# Patient Record
Sex: Male | Born: 1960 | Race: White | Hispanic: No | Marital: Single | Smoking: Current every day smoker
Health system: Southern US, Community
[De-identification: ages and names within clinical notes are randomized; demographics above are authoritative.]

## PROBLEM LIST (undated history)

## (undated) DIAGNOSIS — F319 Bipolar disorder, unspecified: Secondary | ICD-10-CM

## (undated) DIAGNOSIS — K219 Gastro-esophageal reflux disease without esophagitis: Secondary | ICD-10-CM

## (undated) DIAGNOSIS — F22 Delusional disorders: Secondary | ICD-10-CM

## (undated) DIAGNOSIS — K859 Acute pancreatitis without necrosis or infection, unspecified: Secondary | ICD-10-CM

## (undated) DIAGNOSIS — I10 Essential (primary) hypertension: Secondary | ICD-10-CM

## (undated) DIAGNOSIS — F1010 Alcohol abuse, uncomplicated: Secondary | ICD-10-CM

## (undated) DIAGNOSIS — M5136 Other intervertebral disc degeneration, lumbar region: Secondary | ICD-10-CM

## (undated) DIAGNOSIS — M549 Dorsalgia, unspecified: Secondary | ICD-10-CM

## (undated) HISTORY — PX: SPLENECTOMY: SUR1306

## (undated) HISTORY — PX: OTHER SURGICAL HISTORY: SHX169

---

## 2000-09-07 ENCOUNTER — Encounter (INDEPENDENT_AMBULATORY_CARE_PROVIDER_SITE_OTHER): Payer: Self-pay | Admitting: Specialist

## 2000-09-07 ENCOUNTER — Inpatient Hospital Stay (HOSPITAL_COMMUNITY): Admission: EM | Admit: 2000-09-07 | Discharge: 2000-09-14 | Payer: Self-pay | Admitting: *Deleted

## 2000-09-07 ENCOUNTER — Encounter: Payer: Self-pay | Admitting: Surgery

## 2000-09-08 ENCOUNTER — Encounter: Payer: Self-pay | Admitting: Internal Medicine

## 2000-09-11 ENCOUNTER — Encounter: Payer: Self-pay | Admitting: General Surgery

## 2000-09-12 ENCOUNTER — Encounter: Payer: Self-pay | Admitting: General Surgery

## 2001-02-27 ENCOUNTER — Encounter: Payer: Self-pay | Admitting: Internal Medicine

## 2001-02-27 ENCOUNTER — Ambulatory Visit (HOSPITAL_COMMUNITY): Admission: RE | Admit: 2001-02-27 | Discharge: 2001-02-27 | Payer: Self-pay | Admitting: Internal Medicine

## 2001-12-08 ENCOUNTER — Encounter: Payer: Self-pay | Admitting: Emergency Medicine

## 2001-12-08 ENCOUNTER — Emergency Department (HOSPITAL_COMMUNITY): Admission: EM | Admit: 2001-12-08 | Discharge: 2001-12-08 | Payer: Self-pay | Admitting: Emergency Medicine

## 2001-12-19 ENCOUNTER — Observation Stay (HOSPITAL_COMMUNITY): Admission: EM | Admit: 2001-12-19 | Discharge: 2001-12-20 | Payer: Self-pay | Admitting: Emergency Medicine

## 2002-03-21 ENCOUNTER — Emergency Department (HOSPITAL_COMMUNITY): Admission: EM | Admit: 2002-03-21 | Discharge: 2002-03-22 | Payer: Self-pay | Admitting: Emergency Medicine

## 2002-04-20 ENCOUNTER — Emergency Department (HOSPITAL_COMMUNITY): Admission: EM | Admit: 2002-04-20 | Discharge: 2002-04-20 | Payer: Self-pay | Admitting: Emergency Medicine

## 2002-04-20 ENCOUNTER — Encounter: Payer: Self-pay | Admitting: Emergency Medicine

## 2004-10-13 ENCOUNTER — Emergency Department (HOSPITAL_COMMUNITY): Admission: EM | Admit: 2004-10-13 | Discharge: 2004-10-13 | Payer: Self-pay | Admitting: Emergency Medicine

## 2008-05-30 ENCOUNTER — Encounter (HOSPITAL_COMMUNITY)
Admission: RE | Admit: 2008-05-30 | Discharge: 2008-06-29 | Payer: Self-pay | Admitting: Physical Medicine and Rehabilitation

## 2008-06-30 ENCOUNTER — Encounter (HOSPITAL_COMMUNITY)
Admission: RE | Admit: 2008-06-30 | Discharge: 2008-07-30 | Payer: Self-pay | Admitting: Physical Medicine and Rehabilitation

## 2009-01-31 ENCOUNTER — Inpatient Hospital Stay (HOSPITAL_COMMUNITY): Admission: EM | Admit: 2009-01-31 | Discharge: 2009-02-02 | Payer: Self-pay | Admitting: Emergency Medicine

## 2010-03-15 ENCOUNTER — Emergency Department (HOSPITAL_COMMUNITY): Admission: EM | Admit: 2010-03-15 | Discharge: 2010-03-15 | Payer: Self-pay | Admitting: Emergency Medicine

## 2010-06-14 ENCOUNTER — Emergency Department (HOSPITAL_COMMUNITY): Admission: EM | Admit: 2010-06-14 | Discharge: 2010-06-14 | Payer: Self-pay | Admitting: Emergency Medicine

## 2010-10-28 LAB — COMPREHENSIVE METABOLIC PANEL WITH GFR
ALT: 108 U/L — ABNORMAL HIGH (ref 0–53)
ALT: 80 U/L — ABNORMAL HIGH (ref 0–53)
AST: 102 U/L — ABNORMAL HIGH (ref 0–37)
AST: 147 U/L — ABNORMAL HIGH (ref 0–37)
Albumin: 2.9 g/dL — ABNORMAL LOW (ref 3.5–5.2)
Albumin: 3.3 g/dL — ABNORMAL LOW (ref 3.5–5.2)
Alkaline Phosphatase: 105 U/L (ref 39–117)
Alkaline Phosphatase: 127 U/L — ABNORMAL HIGH (ref 39–117)
BUN: 4 mg/dL — ABNORMAL LOW (ref 6–23)
BUN: 7 mg/dL (ref 6–23)
CO2: 22 meq/L (ref 19–32)
CO2: 23 meq/L (ref 19–32)
Calcium: 8.4 mg/dL (ref 8.4–10.5)
Calcium: 8.5 mg/dL (ref 8.4–10.5)
Chloride: 102 meq/L (ref 96–112)
Chloride: 99 meq/L (ref 96–112)
Creatinine, Ser: 0.75 mg/dL (ref 0.4–1.5)
Creatinine, Ser: 0.85 mg/dL (ref 0.4–1.5)
GFR calc non Af Amer: >60 mL/min
GFR calc non Af Amer: >60 mL/min
Glucose, Bld: 147 mg/dL — ABNORMAL HIGH (ref 70–99)
Glucose, Bld: 92 mg/dL (ref 70–99)
Potassium: 3.5 meq/L (ref 3.5–5.1)
Potassium: 3.6 meq/L (ref 3.5–5.1)
Sodium: 132 meq/L — ABNORMAL LOW (ref 135–145)
Sodium: 133 meq/L — ABNORMAL LOW (ref 135–145)
Total Bilirubin: 1.9 mg/dL — ABNORMAL HIGH (ref 0.3–1.2)
Total Bilirubin: 2 mg/dL — ABNORMAL HIGH (ref 0.3–1.2)
Total Protein: 5.3 g/dL — ABNORMAL LOW (ref 6.0–8.3)
Total Protein: 6.2 g/dL (ref 6.0–8.3)

## 2010-10-28 LAB — HEPATITIS PANEL, ACUTE
HCV Ab: NEGATIVE
Hep A IgM: NEGATIVE
Hep B C IgM: NEGATIVE
Hepatitis B Surface Ag: NEGATIVE

## 2010-10-28 LAB — CBC
HCT: 41.3 % (ref 39.0–52.0)
HCT: 48.5 % (ref 39.0–52.0)
Hemoglobin: 14.7 g/dL (ref 13.0–17.0)
MCHC: 35.5 g/dL (ref 30.0–36.0)
MCHC: 36 g/dL (ref 30.0–36.0)
MCV: 99.4 fL (ref 78.0–100.0)
MCV: 99.7 fL (ref 78.0–100.0)
Platelets: 111 10*3/uL — ABNORMAL LOW (ref 150–400)
Platelets: 125 10*3/uL — ABNORMAL LOW (ref 150–400)
Platelets: 134 10*3/uL — ABNORMAL LOW (ref 150–400)
RBC: 4.14 MIL/uL — ABNORMAL LOW (ref 4.22–5.81)
RBC: 4.88 MIL/uL (ref 4.22–5.81)
RDW: 14.1 % (ref 11.5–15.5)
WBC: 12.4 10*3/uL — ABNORMAL HIGH (ref 4.0–10.5)
WBC: 13.6 10*3/uL — ABNORMAL HIGH (ref 4.0–10.5)
WBC: 13.9 10*3/uL — ABNORMAL HIGH (ref 4.0–10.5)

## 2010-10-28 LAB — URINE CULTURE: Colony Count: NO GROWTH

## 2010-10-28 LAB — COMPREHENSIVE METABOLIC PANEL
AST: 77 U/L — ABNORMAL HIGH (ref 0–37)
Albumin: 2.8 g/dL — ABNORMAL LOW (ref 3.5–5.2)
BUN: <1 mg/dL — ABNORMAL LOW (ref 6–23)
CO2: 26 mEq/L (ref 19–32)
Calcium: 8.2 mg/dL — ABNORMAL LOW (ref 8.4–10.5)
Creatinine, Ser: 0.71 mg/dL (ref 0.4–1.5)
GFR calc Af Amer: >60 mL/min (ref >60)
GFR calc non Af Amer: >60 mL/min (ref >60)
Total Bilirubin: 1.6 mg/dL — ABNORMAL HIGH (ref 0.3–1.2)

## 2010-10-28 LAB — URINALYSIS, ROUTINE W REFLEX MICROSCOPIC
Hgb urine dipstick: NEGATIVE
Ketones, ur: 40 mg/dL — AB
Leukocytes, UA: NEGATIVE
Specific Gravity, Urine: >1.03 — ABNORMAL HIGH (ref 1.005–1.030)
Urobilinogen, UA: 4 mg/dL — ABNORMAL HIGH (ref 0.0–1.0)
pH: 5.5 (ref 5.0–8.0)

## 2010-10-28 LAB — DIFFERENTIAL
Basophils Absolute: 0 10*3/uL (ref 0.0–0.1)
Basophils Absolute: 0 10*3/uL (ref 0.0–0.1)
Basophils Relative: 0 % (ref 0–1)
Basophils Relative: 0 % (ref 0–1)
Eosinophils Absolute: 0.1 10*3/uL (ref 0.0–0.7)
Eosinophils Relative: 1 % (ref 0–5)
Eosinophils Relative: 2 % (ref 0–5)
Lymphocytes Relative: 14 % (ref 12–46)
Lymphocytes Relative: 18 % (ref 12–46)
Lymphocytes Relative: 24 % (ref 12–46)
Lymphs Abs: 2.4 10*3/uL (ref 0.7–4.0)
Lymphs Abs: 3 10*3/uL (ref 0.7–4.0)
Monocytes Absolute: 0.5 10*3/uL (ref 0.1–1.0)
Monocytes Relative: 4 % (ref 3–12)
Monocytes Relative: 7 % (ref 3–12)
Neutro Abs: 10.6 10*3/uL — ABNORMAL HIGH (ref 1.7–7.7)
Neutro Abs: 8.5 10*3/uL — ABNORMAL HIGH (ref 1.7–7.7)
Neutrophils Relative %: 77 % (ref 43–77)

## 2010-10-28 LAB — URINE MICROSCOPIC-ADD ON

## 2010-10-28 LAB — RAPID URINE DRUG SCREEN, HOSP PERFORMED
Barbiturates: NOT DETECTED
Benzodiazepines: POSITIVE — AB
Cocaine: NOT DETECTED
Tetrahydrocannabinol: NOT DETECTED

## 2010-10-28 LAB — TRIGLYCERIDES: Triglycerides: 2883 mg/dL — ABNORMAL HIGH (ref <150)
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
## 2011-04-18 ENCOUNTER — Emergency Department (HOSPITAL_COMMUNITY)
Admission: EM | Admit: 2011-04-18 | Discharge: 2011-04-18 | Disposition: A | Discharge status: Home / Self Care | Payer: Medicaid Other | Attending: Emergency Medicine | Admitting: Emergency Medicine

## 2011-04-18 ENCOUNTER — Encounter: Payer: Self-pay | Admitting: *Deleted

## 2011-04-18 DIAGNOSIS — T1590XA Foreign body on external eye, part unspecified, unspecified eye, initial encounter: Secondary | ICD-10-CM | POA: Insufficient documentation

## 2011-04-18 DIAGNOSIS — H16009 Unspecified corneal ulcer, unspecified eye: Secondary | ICD-10-CM | POA: Insufficient documentation

## 2011-04-18 DIAGNOSIS — H538 Other visual disturbances: Secondary | ICD-10-CM | POA: Insufficient documentation

## 2011-04-18 DIAGNOSIS — X58XXXA Exposure to other specified factors, initial encounter: Secondary | ICD-10-CM | POA: Insufficient documentation

## 2011-04-18 DIAGNOSIS — H5710 Ocular pain, unspecified eye: Secondary | ICD-10-CM | POA: Insufficient documentation

## 2011-04-18 DIAGNOSIS — F17200 Nicotine dependence, unspecified, uncomplicated: Secondary | ICD-10-CM | POA: Insufficient documentation

## 2011-04-18 HISTORY — DX: Essential (primary) hypertension: I10

## 2011-04-18 HISTORY — DX: Bipolar disorder, unspecified: F31.9

## 2011-04-18 MED ORDER — TETRACAINE HCL 0.5 % OP SOLN
2.0000 [drp] | Freq: Once | OPHTHALMIC | Status: AC
  Administered 2011-04-18: 2 [drp] via OPHTHALMIC
  Filled 2011-04-18: qty 2

## 2011-04-18 MED ORDER — ERYTHROMYCIN 5 MG/GM OP OINT: TOPICAL_OINTMENT | OPHTHALMIC | Status: AC

## 2011-04-18 MED ORDER — HYDROCODONE-ACETAMINOPHEN 5-325 MG PO TABS
2.0000 | ORAL_TABLET | Freq: Once | ORAL | Status: AC
  Administered 2011-04-18: 2 via ORAL
  Filled 2011-04-18: qty 2

## 2011-04-18 MED ORDER — HYDROCODONE-ACETAMINOPHEN 5-500 MG PO TABS: 1.0000 | ORAL_TABLET | Freq: Four times a day (QID) | ORAL | Status: AC | PRN

## 2011-04-18 MED ORDER — FLUORESCEIN SODIUM 1 MG OP STRP
1.0000 | ORAL_STRIP | Freq: Once | OPHTHALMIC | Status: AC
  Administered 2011-04-18: 11:00:00 via OPHTHALMIC
  Filled 2011-04-18: qty 1
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
## 2011-08-17 ENCOUNTER — Emergency Department (HOSPITAL_COMMUNITY)
Admission: EM | Admit: 2011-08-17 | Discharge: 2011-08-17 | Disposition: A | Discharge status: Home / Self Care | Payer: Medicaid Other | Attending: Emergency Medicine | Admitting: Emergency Medicine

## 2011-08-17 ENCOUNTER — Encounter (HOSPITAL_COMMUNITY): Payer: Self-pay

## 2011-08-17 DIAGNOSIS — M51379 Other intervertebral disc degeneration, lumbosacral region without mention of lumbar back pain or lower extremity pain: Secondary | ICD-10-CM | POA: Insufficient documentation

## 2011-08-17 DIAGNOSIS — S39012A Strain of muscle, fascia and tendon of lower back, initial encounter: Secondary | ICD-10-CM

## 2011-08-17 DIAGNOSIS — F17200 Nicotine dependence, unspecified, uncomplicated: Secondary | ICD-10-CM | POA: Insufficient documentation

## 2011-08-17 DIAGNOSIS — Z9089 Acquired absence of other organs: Secondary | ICD-10-CM | POA: Insufficient documentation

## 2011-08-17 DIAGNOSIS — I10 Essential (primary) hypertension: Secondary | ICD-10-CM | POA: Insufficient documentation

## 2011-08-17 DIAGNOSIS — R000 Tachycardia, unspecified: Secondary | ICD-10-CM | POA: Insufficient documentation

## 2011-08-17 DIAGNOSIS — IMO0002 Reserved for concepts with insufficient information to code with codable children: Secondary | ICD-10-CM | POA: Insufficient documentation

## 2011-08-17 DIAGNOSIS — F319 Bipolar disorder, unspecified: Secondary | ICD-10-CM | POA: Insufficient documentation

## 2011-08-17 DIAGNOSIS — M5137 Other intervertebral disc degeneration, lumbosacral region: Secondary | ICD-10-CM | POA: Insufficient documentation

## 2011-08-17 DIAGNOSIS — X500XXA Overexertion from strenuous movement or load, initial encounter: Secondary | ICD-10-CM | POA: Insufficient documentation

## 2011-08-17 HISTORY — DX: Other intervertebral disc degeneration, lumbar region: M51.36

## 2011-08-17 MED ORDER — HYDROCODONE-ACETAMINOPHEN 5-325 MG PO TABS: 2.0000 | ORAL_TABLET | ORAL | Status: AC | PRN

## 2011-08-17 MED ORDER — HYDROCODONE-ACETAMINOPHEN 5-325 MG PO TABS
2.0000 | ORAL_TABLET | Freq: Once | ORAL | Status: AC
  Administered 2011-08-17: 2 via ORAL
  Filled 2011-08-17: qty 2

## 2011-08-17 NOTE — ED Provider Notes (Signed)
History     CSN: [id number]  Arrival date & time 08/17/11  1033   First MD Initiated Contact with Patient 08/17/11 1101      Chief Complaint  Patient presents with  . Back Pain    (Consider location/radiation/quality/duration/timing/severity/associated sxs/prior treatment) HPI Comments: Patient here with a history of lumbar disc disease with acute onset of lower back pain with radiation down the right leg - states slipped and fell onto right side of lower back - reports pain worse with movement.  Denies weakness, numbness or tingling.  Patient is a 51 y.o. male presenting with back pain. The history is provided by the patient. No language interpreter was used.  Back Pain  This is a new problem. The current episode started yesterday. The problem occurs constantly. The problem has not changed since onset.The pain is associated with falling and lifting heavy objects. The pain is present in the lumbar spine. The quality of the pain is described as stabbing. The pain radiates to the right thigh. The pain is at a severity of 6/10. The pain is moderate. The symptoms are aggravated by twisting and bending. The pain is the same all the time. Stiffness is present all day. Pertinent negatives include no chest pain, no fever, no numbness, no weight loss, no headaches, no abdominal pain, no abdominal swelling, no bowel incontinence, no perianal numbness, no bladder incontinence, no dysuria, no pelvic pain, no leg pain, no paresthesias, no paresis, no tingling and no weakness. He has tried nothing for the symptoms. The treatment provided no relief.    Past Medical History  Diagnosis Date  . Bipolar affective   . Hypertension   . DDD (degenerative disc disease), lumbar     Past Surgical History  Procedure Date  . Spleen spleen removed  . Splenectomy     No family history on file.  History  Substance Use Topics  . Smoking status: Current Everyday Smoker  . Smokeless tobacco: Not on file  .  Alcohol Use: No     occasionally      Review of Systems  Constitutional: Negative for fever and weight loss.  Cardiovascular: Negative for chest pain.  Gastrointestinal: Negative for abdominal pain and bowel incontinence.  Genitourinary: Negative for bladder incontinence, dysuria and pelvic pain.  Musculoskeletal: Positive for back pain.  Neurological: Negative for tingling, weakness, numbness, headaches and paresthesias.  All other systems reviewed and are negative.    Allergies  Review of patient's allergies indicates no known allergies.  Home Medications   Current Outpatient Rx  Name Route Sig Dispense Refill  . CLONAZEPAM 1 MG PO TABS Oral Take 1 mg by mouth 3 (three) times daily as needed. For anxiety     . GABAPENTIN 300 MG PO CAPS Oral Take 300 mg by mouth 3 (three) times daily.      . THIOTHIXENE 5 MG PO CAPS Oral Take 5 mg by mouth at bedtime and may repeat dose one time if needed.        BP 149/104  Pulse 110  Temp(Src) 97.1 F (36.2 C) (Oral)  Resp 20  Ht 6\' 3"  (1.905 m)  Wt 200 lb (90.719 kg)  BMI 25.00 kg/m2  SpO2 98%  Physical Exam  Nursing note and vitals reviewed. Constitutional: He is oriented to person, place, and time. He appears well-developed and well-nourished. No distress.  HENT:  Head: Normocephalic and atraumatic.  Right Ear: External ear normal.  Left Ear: External ear normal.  Nose: Nose normal.  Mouth/Throat: Oropharynx is clear and moist. No oropharyngeal exudate.  Eyes: Conjunctivae are normal. Pupils are equal, round, and reactive to light. No scleral icterus.  Neck: Normal range of motion. Neck supple.  Cardiovascular: Normal rate, regular rhythm and normal heart sounds.  Exam reveals no gallop and no friction rub.   No murmur heard.      tachycardia  Pulmonary/Chest: Effort normal and breath sounds normal. He exhibits no tenderness.  Abdominal: Soft. Bowel sounds are normal. He exhibits no distension. There is no tenderness.    Musculoskeletal:       Lumbar back: He exhibits decreased range of motion, tenderness, pain and spasm. He exhibits no bony tenderness, no swelling and no edema.  Lymphadenopathy:    He has no cervical adenopathy.  Neurological: He is alert and oriented to person, place, and time. He has normal reflexes. No cranial nerve deficit. He exhibits normal muscle tone. Coordination normal.  Skin: Skin is warm and dry.  Psychiatric: He has a normal mood and affect. His behavior is normal. Judgment and thought content normal.    ED Course  Procedures (including critical care time)  Labs Reviewed - No data to display No results found.   Lumbar strain    MDM  Patient without alarming signs of cauda equina or epidural abscess or hematoma. Believe increase in heart rate and blood pressure related to pain.       Izola Price [place], [place] 08/17/11 1112

## 2011-08-17 NOTE — ED Notes (Signed)
Pt presents with right sided lower back pain that started yesterday after carrying wood into house.
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
## 2011-08-19 NOTE — ED Provider Notes (Signed)
Medical screening examination/treatment/procedure(s) were performed by non-physician practitioner and as supervising physician I was immediately available for consultation/collaboration.  Vikki Gains, MD 08/19/11 1045 

## 2011-09-16 ENCOUNTER — Emergency Department (HOSPITAL_COMMUNITY)
Admission: EM | Admit: 2011-09-16 | Discharge: 2011-09-16 | Disposition: A | Discharge status: Home / Self Care | Payer: Medicaid Other | Attending: Emergency Medicine | Admitting: Emergency Medicine

## 2011-09-16 ENCOUNTER — Encounter (HOSPITAL_COMMUNITY): Payer: Self-pay | Admitting: *Deleted

## 2011-09-16 DIAGNOSIS — F17200 Nicotine dependence, unspecified, uncomplicated: Secondary | ICD-10-CM | POA: Insufficient documentation

## 2011-09-16 DIAGNOSIS — R6884 Jaw pain: Secondary | ICD-10-CM | POA: Insufficient documentation

## 2011-09-16 DIAGNOSIS — R221 Localized swelling, mass and lump, neck: Secondary | ICD-10-CM | POA: Insufficient documentation

## 2011-09-16 DIAGNOSIS — R000 Tachycardia, unspecified: Secondary | ICD-10-CM | POA: Insufficient documentation

## 2011-09-16 DIAGNOSIS — K089 Disorder of teeth and supporting structures, unspecified: Secondary | ICD-10-CM | POA: Insufficient documentation

## 2011-09-16 DIAGNOSIS — Z79899 Other long term (current) drug therapy: Secondary | ICD-10-CM | POA: Insufficient documentation

## 2011-09-16 DIAGNOSIS — R220 Localized swelling, mass and lump, head: Secondary | ICD-10-CM | POA: Insufficient documentation

## 2011-09-16 DIAGNOSIS — K047 Periapical abscess without sinus: Secondary | ICD-10-CM | POA: Insufficient documentation

## 2011-09-16 HISTORY — DX: Dorsalgia, unspecified: M54.9

## 2011-09-16 MED ORDER — HYDROCODONE-ACETAMINOPHEN 5-325 MG PO TABS: 1.0000 | ORAL_TABLET | ORAL | Status: AC | PRN

## 2011-09-16 MED ORDER — AMOXICILLIN 500 MG PO CAPS: 500.0000 mg | ORAL_CAPSULE | Freq: Three times a day (TID) | ORAL | Status: AC

## 2011-09-16 MED ORDER — AMOXICILLIN 250 MG PO CAPS
500.0000 mg | ORAL_CAPSULE | Freq: Once | ORAL | Status: AC
  Administered 2011-09-16: 500 mg via ORAL
  Filled 2011-09-16: qty 2

## 2011-09-16 MED ORDER — HYDROCODONE-ACETAMINOPHEN 5-325 MG PO TABS
1.0000 | ORAL_TABLET | Freq: Once | ORAL | Status: AC
  Administered 2011-09-16: 1 via ORAL
  Filled 2011-09-16: qty 1

## 2011-09-16 NOTE — ED Notes (Signed)
Patient with dental pain that started last night and once he got up this morning, noticed large amount of swelling to left cheek and jaw, denies any difficulty breathing

## 2011-09-16 NOTE — Discharge Instructions (Signed)
Dental Abscess °A dental abscess usually starts from an infected tooth. Antibiotic medicine and pain pills can be helpful, but dental infections require the attention of a dentist. Rinse around the infected area often with salt water (a pinch of salt in 8 oz of warm water). Do not apply heat to the outside of your face. See your dentist or oral surgeon as soon as possible.  °SEEK IMMEDIATE MEDICAL CARE IF: °· You have increasing, severe pain that is not relieved by medicine.  °· You or your child has an oral temperature above 102° F (38.9° C), not controlled by medicine.  °· Your baby is older than 3 months with a rectal temperature of 102° F (38.9° C) or higher.  °· Your baby is 3 months old or younger with a rectal temperature of 100.4° F (38° C) or higher.  °· You develop chills, severe headache, difficulty breathing, or trouble swallowing.  °· You have swelling in the neck or around the eye.  °Document Released: 07/08/2005 Document Revised: 03/20/2011 Document Reviewed: 12/17/2006 °[employer]® Patient Information ©2012 [employer]. °

## 2011-09-22 NOTE — ED Provider Notes (Signed)
History     CSN: [id number]  Arrival date & time 09/16/11  1235   First MD Initiated Contact with Patient 09/16/11 1527      Chief Complaint  Patient presents with  . Facial Swelling  . Dental Pain    (Consider location/radiation/quality/duration/timing/severity/associated sxs/prior treatment) Patient is a 51 y.o. male presenting with tooth pain. The history is provided by the patient.  Dental PainThe primary symptoms include mouth pain. Primary symptoms do not include headaches, fever, shortness of breath or sore throat. The symptoms began yesterday. The symptoms are worsening. The symptoms are new. The symptoms occur constantly.  Mouth pain began 12 - 24 hours ago. Mouth pain occurs constantly. Affected locations include: teeth. At its highest the mouth pain was at 10/10. The mouth pain is currently at 10/10.  Additional symptoms include: dental sensitivity to temperature, gum swelling, gum tenderness, jaw pain and facial swelling. Additional symptoms do not include: swollen glands.    Past Medical History  Diagnosis Date  . Bipolar affective   . Hypertension   . DDD (degenerative disc disease), lumbar   . Back pain     Past Surgical History  Procedure Date  . Spleen spleen removed  . Splenectomy     History reviewed. No pertinent family history.  History  Substance Use Topics  . Smoking status: Current Everyday Smoker -- 0.5 packs/day    Types: Cigarettes  . Smokeless tobacco: Not on file  . Alcohol Use: Yes     occasionally      Review of Systems  Constitutional: Negative for fever.  HENT: Positive for facial swelling and dental problem. Negative for congestion, sore throat and neck pain.   Eyes: Negative.   Respiratory: Negative for chest tightness and shortness of breath.   Cardiovascular: Negative for chest pain.  Gastrointestinal: Negative for nausea and abdominal pain.  Genitourinary: Negative.   Musculoskeletal: Negative for joint swelling and  arthralgias.  Skin: Negative.  Negative for rash and wound.  Neurological: Negative for dizziness, weakness, light-headedness, numbness and headaches.  Hematological: Negative.   Psychiatric/Behavioral: Negative.     Allergies  Review of patient's allergies indicates no known allergies.  Home Medications   Current Outpatient Rx  Name Route Sig Dispense Refill  . ALBUTEROL SULFATE HFA 108 (90 BASE) MCG/ACT IN AERS Inhalation Inhale 2 puffs into the lungs 4 (four) times daily as needed. For shortness of breath    . BC HEADACHE PO Oral Take 1-2 packets by mouth as needed. For pain    . CLONAZEPAM 1 MG PO TABS Oral Take 1 mg by mouth 3 (three) times daily as needed. For anxiety     . GABAPENTIN 300 MG PO CAPS Oral Take 300 mg by mouth 3 (three) times daily.      . THIOTHIXENE 5 MG PO CAPS Oral Take 5 mg by mouth at bedtime and may repeat dose one time if needed.      . AMOXICILLIN 500 MG PO CAPS Oral Take 1 capsule (500 mg total) by mouth 3 (three) times daily. 30 capsule 0  . HYDROCODONE-ACETAMINOPHEN 5-325 MG PO TABS Oral Take 1 tablet by mouth every 4 (four) hours as needed for pain. 15 tablet 0    BP 121/95  Pulse 137  Temp(Src) 98.9 F (37.2 C) (Oral)  Resp 20  Ht 6\' 3"  (1.905 m)  Wt 210 lb (95.255 kg)  BMI 26.25 kg/m2  SpO2 97%  Physical Exam  Constitutional: He is oriented to person, place, and  time. He appears well-developed and well-nourished. No distress.  HENT:  Head: Normocephalic and atraumatic.  Right Ear: Tympanic membrane and external ear normal.  Left Ear: Tympanic membrane and external ear normal.  Mouth/Throat: Oropharynx is clear and moist and mucous membranes are normal. No oral lesions. Dental abscesses present.       Poor dentition.  Edema of left lower gingiva along molar teeth,  No fluctuance or pointing.  Small amount of induration of left lower cheek,  No erythema.  Eyes: Conjunctivae are normal.  Neck: Normal range of motion. Neck supple.    Cardiovascular: Normal rate and normal heart sounds.   No murmur heard.      Tachycardia noted during triage,  Pulse rate 88 during exam.  Pulmonary/Chest: Effort normal. No respiratory distress.  Abdominal: He exhibits no distension.  Musculoskeletal: Normal range of motion.  Lymphadenopathy:    He has no cervical adenopathy.  Neurological: He is alert and oriented to person, place, and time.  Skin: Skin is warm and dry. No erythema.  Psychiatric: He has a normal mood and affect.    ED Course  Procedures (including critical care time)  Labs Reviewed - No data to display No results found.   1. Dental abscess       MDM  Amoxil,  Hydrocodone.  Referral to dental clinics.  Suspect initial tachycardia erroneous measurement.  Patient in no distress except for dental pain.        Candis Musa, PA 09/22/11 2128

## 2011-09-25 NOTE — ED Provider Notes (Signed)
Evaluation and management procedures were performed by the PA/NP under my supervision/collaboration.   Alper Guilmette D Octavis Sheeler, MD 09/25/11 2022 

## 2012-04-05 ENCOUNTER — Emergency Department (HOSPITAL_COMMUNITY): Payer: Medicaid Other

## 2012-04-05 ENCOUNTER — Emergency Department (HOSPITAL_COMMUNITY)
Admission: EM | Admit: 2012-04-05 | Discharge: 2012-04-05 | Disposition: A | Discharge status: Home / Self Care | Payer: Medicaid Other | Attending: Emergency Medicine | Admitting: Emergency Medicine

## 2012-04-05 ENCOUNTER — Encounter (HOSPITAL_COMMUNITY): Payer: Self-pay | Admitting: Emergency Medicine

## 2012-04-05 DIAGNOSIS — S4990XA Unspecified injury of shoulder and upper arm, unspecified arm, initial encounter: Secondary | ICD-10-CM

## 2012-04-05 DIAGNOSIS — F319 Bipolar disorder, unspecified: Secondary | ICD-10-CM | POA: Insufficient documentation

## 2012-04-05 DIAGNOSIS — S4980XA Other specified injuries of shoulder and upper arm, unspecified arm, initial encounter: Secondary | ICD-10-CM | POA: Insufficient documentation

## 2012-04-05 DIAGNOSIS — I10 Essential (primary) hypertension: Secondary | ICD-10-CM | POA: Insufficient documentation

## 2012-04-05 DIAGNOSIS — S46909A Unspecified injury of unspecified muscle, fascia and tendon at shoulder and upper arm level, unspecified arm, initial encounter: Secondary | ICD-10-CM | POA: Insufficient documentation

## 2012-04-05 DIAGNOSIS — F17200 Nicotine dependence, unspecified, uncomplicated: Secondary | ICD-10-CM | POA: Insufficient documentation

## 2012-04-05 MED ORDER — OXYCODONE-ACETAMINOPHEN 5-325 MG PO TABS
1.0000 | ORAL_TABLET | Freq: Once | ORAL | Status: AC
  Administered 2012-04-05: 1 via ORAL
  Filled 2012-04-05: qty 1

## 2012-04-05 MED ORDER — OXYCODONE-ACETAMINOPHEN 5-325 MG PO TABS: 1.0000 | ORAL_TABLET | ORAL | Status: AC | PRN

## 2012-04-05 NOTE — ED Notes (Signed)
Pt with right shoulder pain x one month since a tree branch fell on his arm, states that tingling radiates down arm to hand, states taking Advil at home without relief and at times has taken up to 6 pills at one time, denies any bloody stools

## 2012-04-05 NOTE — ED Notes (Signed)
Mayer Camel, NP with pt at this time

## 2012-04-05 NOTE — ED Notes (Signed)
Per patient: branch fell on arm about a month ago, pain has been going on ever since. Pain is in upper arm. Taking Advil at home for pain

## 2012-04-05 NOTE — ED Provider Notes (Signed)
History     CSN: [id number]  Arrival date & time 04/05/12  1118   First MD Initiated Contact with Patient 04/05/12 1134      Chief Complaint  Patient presents with  . Arm Pain    HPI Kirk Harper is a 51 y.o. male who present to the ED with right shoulder pain. He states that the pain started about a month ago when a tree limb fell on his right arm. He has been taking 6 advil tablets at once for pain. The pain has increased to the point he can not take the pain any longer. He states he has not come before because he is Bipolar and does not like to be around a lot of people. The pain increases with movement. The pain radiates to the right arm down to the fingers with tingling. He describes the pain as throbbing like a tooth ache.  Past Medical History  Diagnosis Date  . Bipolar affective   . Hypertension   . DDD (degenerative disc disease), lumbar   . Back pain     Past Surgical History  Procedure Date  . Spleen spleen removed  . Splenectomy     No family history on file.  History  Substance Use Topics  . Smoking status: Current Every Day Smoker -- 0.5 packs/day    Types: Cigarettes  . Smokeless tobacco: Not on file  . Alcohol Use: Yes     occasionally      Review of Systems  Constitutional: Negative for fever, chills, diaphoresis and fatigue.  HENT: Negative for ear pain, congestion, sore throat, neck pain, neck stiffness, dental problem and sinus pressure.   Eyes: Negative for photophobia, pain and discharge.  Respiratory: Positive for cough (smoker). Negative for chest tightness and wheezing.   Cardiovascular: Negative for chest pain and palpitations.  Gastrointestinal: Negative for nausea, vomiting, abdominal pain, diarrhea, constipation and abdominal distention.  Genitourinary: Negative for dysuria, frequency, flank pain and difficulty urinating.  Musculoskeletal: Negative for myalgias, back pain and gait problem.       Right shoulder pain.  Skin: Negative  for color change and rash.  Neurological: Negative for dizziness, speech difficulty, weakness, light-headedness, numbness and headaches.  Psychiatric/Behavioral: Negative for confusion and agitation.       Bipolar     Allergies  Review of patient's allergies indicates no known allergies.  Home Medications   Current Outpatient Rx  Name Route Sig Dispense Refill  . GABAPENTIN 300 MG PO CAPS Oral Take 300 mg by mouth 3 (three) times daily.      Marland Kitchen OVER THE COUNTER MEDICATION Oral Take 2-6 tablets by mouth every 6 (six) hours as needed. For pain ( OTC pain reliever)    . THIOTHIXENE 5 MG PO CAPS Oral Take 5 mg by mouth at bedtime and may repeat dose one time if needed.        BP 155/110  Pulse 93  Temp 97.4 F (36.3 C) (Oral)  Ht 6\' 2"  (1.88 m)  Wt 185 lb (83.915 kg)  BMI 23.75 kg/m2  SpO2 96%  Physical Exam  Nursing note and vitals reviewed. Constitutional: He is oriented to person, place, and time. He appears well-developed and well-nourished. No distress.  HENT:  Head: Normocephalic and atraumatic.  Eyes: EOM are normal. Pupils are equal, round, and reactive to light.  Neck: Neck supple.  Cardiovascular: Normal rate and regular rhythm.   Pulmonary/Chest: Effort normal and breath sounds normal. No respiratory distress. He has no  wheezes.  Musculoskeletal: He exhibits no edema.       Pain with range of motion right shoulder. Good grips and equal bilateral. Radial pulse present and adequate circulation. Pain increases with raising hand over head. Tender on palpation posterior aspect of right shoulder.  Neurological: He is alert and oriented to person, place, and time. He has normal strength. No cranial nerve deficit or sensory deficit.  Skin: Skin is warm and dry.  Psychiatric: His speech is normal and behavior is normal. Judgment and thought content normal. His mood appears anxious.   Dg Shoulder Right  04/05/2012  *RADIOLOGY REPORT*  Clinical Data: Progressive right shoulder  pain after injury 1 month ago.  RIGHT SHOULDER - 2+ VIEW  Comparison: None.  Findings: No evidence of acute fracture or glenohumeral dislocation.  Subacromial space well preserved.  Acromioclavicular joint intact without significant degenerative change.  No intrinsic osseous abnormalities.  IMPRESSION: Normal examination.   Original Report Authenticated By: Arnell Sieving, M.D.    Assessment: 51 y.o. male with pain in the right shoulder  Plan:  Pain management   Sling   Ortho follow up I discussed in detail with the patient to decrease his dose of advil from 6 pills every 6 hours to 3 pills every 6 hours. I discussed results of x-rays and need for follow up with Ortho. The patient voices understanding.   Discussed with the patient and all questioned fully answered.   Medication List     As of 04/05/2012  3:04 PM    START taking these medications         oxyCODONE-acetaminophen 5-325 MG per tablet   Commonly known as: PERCOCET/ROXICET   Take 1 tablet by mouth every 4 (four) hours as needed for pain.      ASK your doctor about these medications         gabapentin 300 MG capsule   Commonly known as: NEURONTIN      OVER THE COUNTER MEDICATION      thiothixene 5 MG capsule   Commonly known as: NAVANE          Where to get your medications    These are the prescriptions that you need to pick up.   You may get these medications from any pharmacy.         oxyCODONE-acetaminophen 5-325 MG per tablet               ED Course  Procedures         [facility], NP 04/05/12 1504

## 2012-04-07 NOTE — ED Provider Notes (Signed)
Medical screening examination/treatment/procedure(s) were performed by non-physician practitioner and as supervising physician I was immediately available for consultation/collaboration.  Donnetta Hutching, MD 04/07/12 [phone]

## 2012-04-11 ENCOUNTER — Encounter (HOSPITAL_COMMUNITY): Payer: Self-pay

## 2012-04-11 ENCOUNTER — Emergency Department (HOSPITAL_COMMUNITY)
Admission: EM | Admit: 2012-04-11 | Discharge: 2012-04-11 | Disposition: A | Discharge status: Home / Self Care | Payer: Medicaid Other | Attending: Emergency Medicine | Admitting: Emergency Medicine

## 2012-04-11 DIAGNOSIS — I10 Essential (primary) hypertension: Secondary | ICD-10-CM | POA: Insufficient documentation

## 2012-04-11 DIAGNOSIS — Z760 Encounter for issue of repeat prescription: Secondary | ICD-10-CM | POA: Insufficient documentation

## 2012-04-11 DIAGNOSIS — M25519 Pain in unspecified shoulder: Secondary | ICD-10-CM | POA: Insufficient documentation

## 2012-04-11 DIAGNOSIS — F17200 Nicotine dependence, unspecified, uncomplicated: Secondary | ICD-10-CM | POA: Insufficient documentation

## 2012-04-11 DIAGNOSIS — F319 Bipolar disorder, unspecified: Secondary | ICD-10-CM | POA: Insufficient documentation

## 2012-04-11 HISTORY — DX: Delusional disorders: F22

## 2012-04-11 MED ORDER — METHOCARBAMOL 500 MG PO TABS: 500.0000 mg | ORAL_TABLET | Freq: Two times a day (BID) | ORAL | Status: DC

## 2012-04-11 MED ORDER — TRAMADOL HCL 50 MG PO TABS: 50.0000 mg | ORAL_TABLET | Freq: Four times a day (QID) | ORAL | Status: DC | PRN

## 2012-04-11 NOTE — ED Provider Notes (Signed)
History  This chart was scribed for Kirk Octave, MD by Erskine Emery. This patient was seen in room APA19/APA19 and the patient's care was started at 7:24.   CSN: [id number]  Arrival date & time 04/11/12  0714   First MD Initiated Contact with Patient 04/11/12 [phone]      Chief Complaint  Patient presents with  . Arm Pain  . Medication Refill    (Consider location/radiation/quality/duration/timing/severity/associated sxs/prior treatment) The history is provided by the patient. No language interpreter was used.  Kirk Harper is a 51 y.o. male who presents to the Emergency Department complaining of right arm pain, from the forearm to the right side of the neck, and arm numbness since getting hit with a tree branch 1 month ago. Pt did not visit the hospital just after the incident but he was here last Sunday for the same complaint; he was given pain medication and released with instructions to follow up with Dr. Hilda Lias (orthopedic surgeon). The pt's appointment was cancelled due to medicaid complications and he is now out of pain medication. Pt reports a h/o bipolar disorder and paranoia.   Past Medical History  Diagnosis Date  . Bipolar affective   . Hypertension   . DDD (degenerative disc disease), lumbar   . Back pain   . Paranoia     Past Surgical History  Procedure Date  . Spleen spleen removed  . Splenectomy     No family history on file.  History  Substance Use Topics  . Smoking status: Current Every Day Smoker -- 0.5 packs/day    Types: Cigarettes  . Smokeless tobacco: Not on file  . Alcohol Use: Yes     occasionally      Review of Systems A complete 10 system review of systems was obtained and all systems are negative except as noted in the HPI and PMH.    Allergies  Review of patient's allergies indicates no known allergies.  Home Medications   Current Outpatient Rx  Name Route Sig Dispense Refill  . GABAPENTIN 300 MG PO CAPS Oral Take 300 mg by  mouth 3 (three) times daily.      Marland Kitchen OVER THE COUNTER MEDICATION Oral Take 2-6 tablets by mouth every 6 (six) hours as needed. For pain ( OTC pain reliever)    . OXYCODONE-ACETAMINOPHEN 5-325 MG PO TABS Oral Take 1 tablet by mouth every 4 (four) hours as needed for pain. 15 tablet 0  . THIOTHIXENE 5 MG PO CAPS Oral Take 5 mg by mouth at bedtime and may repeat dose one time if needed.        Triage Vitals: BP 128/96  Pulse 97  Temp 97.7 F (36.5 C) (Oral)  Resp 20  SpO2 99%  Physical Exam  Nursing note and vitals reviewed. Constitutional: He is oriented to person, place, and time. He appears well-developed and well-nourished. No distress.  HENT:  Head: Normocephalic and atraumatic.  Eyes: EOM are normal. Pupils are equal, round, and reactive to light.  Neck: Neck supple. No tracheal deviation present.  Cardiovascular: Normal rate.   Pulmonary/Chest: Effort normal. No respiratory distress.  Abdominal: Soft. He exhibits no distension.  Musculoskeletal: Normal range of motion. He exhibits no edema.       No obvious injury. Full ROM of shoulder and elbow. Pain increases with raising the arm over his head. Cranial nerves intact. +2 radial pulse.  Neurological: He is alert and oriented to person, place, and time.  Skin: Skin is warm  and dry.  Psychiatric: He has a normal mood and affect.    ED Course  Procedures (including critical care time) DIAGNOSTIC STUDIES: Oxygen Saturation is 99% on room air, normal by my interpretation.    COORDINATION OF CARE: 7:40--I evaluated the patient and we discussed a treatment plan including review of x-rays to which the pt agreed.   7:47--I rechecked the pt and notified him that his x-rays look fine.   Labs Reviewed - No data to display No results found.   No diagnosis found.    MDM  R shoulder pain x 1 month after getting hit with tree branch, seen 1 week ago for same. No weakness in grip strength.  Pain with ROM of shoulder. +2 pulses,  cardinal hand movements intact. Xrays reviewed. Nonnarcotic treatment of contusion, possible cervical radiculopathy. F/u ortho as previously instructed.     I personally performed the services described in this documentation, which was scribed in my presence.  The recorded information has been reviewed and considered.   Kirk Octave, MD 04/11/12 [phone]

## 2012-04-11 NOTE — ED Notes (Signed)
Pt injured right arm 1 month ago, has not followed up with ortho, and os now out of meds for pain

## 2012-07-27 ENCOUNTER — Encounter (HOSPITAL_COMMUNITY): Payer: Self-pay

## 2012-07-27 ENCOUNTER — Emergency Department (HOSPITAL_COMMUNITY)
Admission: EM | Admit: 2012-07-27 | Discharge: 2012-07-27 | Disposition: A | Discharge status: Home / Self Care | Payer: Medicaid Other | Attending: Emergency Medicine | Admitting: Emergency Medicine

## 2012-07-27 DIAGNOSIS — Z87828 Personal history of other (healed) physical injury and trauma: Secondary | ICD-10-CM | POA: Insufficient documentation

## 2012-07-27 DIAGNOSIS — F319 Bipolar disorder, unspecified: Secondary | ICD-10-CM | POA: Insufficient documentation

## 2012-07-27 DIAGNOSIS — Z9089 Acquired absence of other organs: Secondary | ICD-10-CM | POA: Insufficient documentation

## 2012-07-27 DIAGNOSIS — I10 Essential (primary) hypertension: Secondary | ICD-10-CM | POA: Insufficient documentation

## 2012-07-27 DIAGNOSIS — Z79899 Other long term (current) drug therapy: Secondary | ICD-10-CM | POA: Insufficient documentation

## 2012-07-27 DIAGNOSIS — F17200 Nicotine dependence, unspecified, uncomplicated: Secondary | ICD-10-CM | POA: Insufficient documentation

## 2012-07-27 DIAGNOSIS — Z711 Person with feared health complaint in whom no diagnosis is made: Secondary | ICD-10-CM | POA: Insufficient documentation

## 2012-07-27 DIAGNOSIS — M519 Unspecified thoracic, thoracolumbar and lumbosacral intervertebral disc disorder: Secondary | ICD-10-CM | POA: Insufficient documentation

## 2012-07-27 DIAGNOSIS — F22 Delusional disorders: Secondary | ICD-10-CM | POA: Insufficient documentation

## 2012-07-27 NOTE — ED Notes (Signed)
Pt seen by dr.wickline. Plan of care discussed.

## 2012-07-27 NOTE — ED Notes (Signed)
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  Pt denies any negative symptoms.

## 2012-07-27 NOTE — ED Provider Notes (Signed)
History   This chart was scribed for Kirk Gaskins, MD by Charolett Bumpers, ED Scribe. The patient was seen in room APA04/APA04. Patient's care was started at 0826.   CSN: [id number]  Arrival date & time 07/27/12  4098   First MD Initiated Contact with Patient 07/27/12 [phone]      No chief complaint on file.  The history is provided by the patient. No language interpreter was used.   KEN BONN is a 52 y.o. male who presents to the Emergency Department for an injection. 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 He states that he is not sure what injection he needs and is unsure who his PCP's name is. He denies any fever, vomiting, diarrhea, abdominal pain, chest pain, SOB. He states that he is feeling at baseline and denies any complaints. He has a h/o HTN, DDD, bipolar affective disorder.   Past Medical History  Diagnosis Date  . Bipolar affective   . Hypertension   . DDD (degenerative disc disease), lumbar   . Back pain   . Paranoia     Past Surgical History  Procedure Date  . Spleen spleen removed  . Splenectomy     No family history on file.  History  Substance Use Topics  . Smoking status: Current Every Day Smoker -- 0.5 packs/day    Types: Cigarettes  . Smokeless tobacco: Not on file  . Alcohol Use: Yes     Comment: occasionally      Review of Systems  Constitutional: Negative for fever.  Respiratory: Negative for shortness of breath.   Cardiovascular: Negative for chest pain.    Allergies  Review of patient's allergies indicates no known allergies.  Home Medications   Current Outpatient Rx  Name  Route  Sig  Dispense  Refill  . GABAPENTIN 300 MG PO CAPS   Oral   Take 300 mg by mouth 3 (three) times daily.           Marland Kitchen METHOCARBAMOL 500 MG PO TABS   Oral   Take 1 tablet (500 mg total) by mouth 2 (two) times daily.   20 tablet   0   . OVER THE COUNTER MEDICATION  Oral   Take 2-6 tablets by mouth every 6 (six) hours as needed. For pain ( OTC pain reliever)         . THIOTHIXENE 5 MG PO CAPS   Oral   Take 5 mg by mouth at bedtime and may repeat dose one time if needed.           Marland Kitchen TRAMADOL HCL 50 MG PO TABS   Oral   Take 1 tablet (50 mg total) by mouth every 6 (six) hours as needed for pain.   15 tablet   0     BP 153/111  Pulse 119  Resp 18  SpO2 97%  Physical Exam CONSTITUTIONAL: Well developed/well nourished HEAD AND FACE: Normocephalic/atraumatic ENMT: Mucous membranes moist NECK: supple no meningeal signs CV: S1/S2 noted, no murmurs/rubs/gallops noted LUNGS: Lungs are clear to auscultation bilaterally, no apparent distress ABDOMEN: soft, nontender, no rebound or guarding GU:no cva tenderness NEURO: Pt is awake/alert, moves all extremitiesx4 EXTREMITIES: pulses normal, full ROM SKIN: warm, color normal PSYCH: no abnormalities of mood noted ED Course  Procedures   DIAGNOSTIC STUDIES: Oxygen Saturation is 97% on room air, adequate by my interpretation.    COORDINATION OF CARE:  08:32-Performed physical exam. Advised pt to contact PCP to find out what injection he needs and have them call ED.   This best handled by his PCP and I advised this to patient.  He may require yearly flu/HIB/pneumovax but pt is unsure even what he is supposed to get.  He is otherwise at baseline  MDM  Nursing notes including past medical history and social history reviewed and considered in documentation    I personally performed the services described in this documentation, which was scribed in my presence. The recorded information has been reviewed and is accurate.         Kirk Gaskins, MD 07/27/12 [phone]

## 2012-09-29 ENCOUNTER — Emergency Department (HOSPITAL_COMMUNITY)
Admission: EM | Admit: 2012-09-29 | Discharge: 2012-09-29 | Disposition: A | Discharge status: Home / Self Care | Payer: Medicaid Other | Attending: Emergency Medicine | Admitting: Emergency Medicine

## 2012-09-29 ENCOUNTER — Encounter (HOSPITAL_COMMUNITY): Payer: Self-pay | Admitting: Emergency Medicine

## 2012-09-29 DIAGNOSIS — Z79899 Other long term (current) drug therapy: Secondary | ICD-10-CM | POA: Insufficient documentation

## 2012-09-29 DIAGNOSIS — F319 Bipolar disorder, unspecified: Secondary | ICD-10-CM | POA: Insufficient documentation

## 2012-09-29 DIAGNOSIS — M549 Dorsalgia, unspecified: Secondary | ICD-10-CM | POA: Insufficient documentation

## 2012-09-29 DIAGNOSIS — Z8739 Personal history of other diseases of the musculoskeletal system and connective tissue: Secondary | ICD-10-CM | POA: Insufficient documentation

## 2012-09-29 DIAGNOSIS — L0201 Cutaneous abscess of face: Secondary | ICD-10-CM | POA: Insufficient documentation

## 2012-09-29 DIAGNOSIS — Z8659 Personal history of other mental and behavioral disorders: Secondary | ICD-10-CM | POA: Insufficient documentation

## 2012-09-29 DIAGNOSIS — L03211 Cellulitis of face: Secondary | ICD-10-CM | POA: Insufficient documentation

## 2012-09-29 DIAGNOSIS — I10 Essential (primary) hypertension: Secondary | ICD-10-CM | POA: Insufficient documentation

## 2012-09-29 DIAGNOSIS — F17200 Nicotine dependence, unspecified, uncomplicated: Secondary | ICD-10-CM | POA: Insufficient documentation

## 2012-09-29 MED ORDER — AMOXICILLIN 500 MG PO CAPS: 500.0000 mg | ORAL_CAPSULE | Freq: Three times a day (TID) | ORAL | Status: DC

## 2012-09-29 MED ORDER — DOXYCYCLINE HYCLATE 100 MG PO CAPS: 100.0000 mg | ORAL_CAPSULE | Freq: Two times a day (BID) | ORAL | Status: DC

## 2012-09-29 NOTE — ED Notes (Signed)
Abscess to right jaw x 3 days

## 2012-09-29 NOTE — ED Provider Notes (Signed)
History     CSN: [id number]  Arrival date & time 09/29/12  0845   First MD Initiated Contact with Patient 09/29/12 0915      Chief Complaint  Patient presents with  . Abscess    (Consider location/radiation/quality/duration/timing/severity/associated sxs/prior treatment) Patient is a 52 y.o. male presenting with abscess. The history is provided by the patient.  Abscess Location:  Face Facial abscess location:  Chin and R cheek Abscess quality: painful, redness and warmth   Abscess quality: not draining   Red streaking: no   Pain details:    Quality:  Aching   Severity:  Moderate   Duration:  3 days   Timing:  Constant   Progression:  Worsening Chronicity:  New Context: skin injury   Relieved by:  Nothing Exacerbated by: palpation. Ineffective treatments:  None tried Associated symptoms: no fever, no nausea and no vomiting     Past Medical History  Diagnosis Date  . Bipolar affective   . Hypertension   . DDD (degenerative disc disease), lumbar   . Back pain   . Paranoia     Past Surgical History  Procedure Laterality Date  . Spleen  spleen removed  . Splenectomy      No family history on file.  History  Substance Use Topics  . Smoking status: Current Every Day Smoker -- 0.50 packs/day    Types: Cigarettes  . Smokeless tobacco: Not on file  . Alcohol Use: Yes     Comment: occasionally      Review of Systems  Constitutional: Negative for fever and activity change.       All ROS Neg except as noted in HPI  HENT: Negative for nosebleeds and neck pain.   Eyes: Negative for photophobia and discharge.  Respiratory: Negative for cough, shortness of breath and wheezing.   Cardiovascular: Negative for chest pain and palpitations.  Gastrointestinal: Negative for nausea, vomiting, abdominal pain and blood in stool.  Genitourinary: Negative for dysuria, frequency and hematuria.  Musculoskeletal: Positive for back pain. Negative for arthralgias.  Skin:  Negative.   Neurological: Negative for dizziness, seizures and speech difficulty.  Psychiatric/Behavioral: Negative for hallucinations and confusion.       Bipolar     Allergies  Review of patient's allergies indicates no known allergies.  Home Medications   Current Outpatient Rx  Name  Route  Sig  Dispense  Refill  . Aspirin-Salicylamide-Caffeine (BC HEADACHE POWDER PO)   Oral   Take 1 packet by mouth daily as needed (for headaches.).         Marland Kitchen gabapentin (NEURONTIN) 300 MG capsule   Oral   Take 300 mg by mouth 3 (three) times daily.           . methocarbamol (ROBAXIN) 500 MG tablet   Oral   Take 1 tablet (500 mg total) by mouth 2 (two) times daily.   20 tablet   0   . thiothixene (NAVANE) 5 MG capsule   Oral   Take 5 mg by mouth at bedtime and may repeat dose one time if needed.           . traMADol (ULTRAM) 50 MG tablet   Oral   Take 1 tablet (50 mg total) by mouth every 6 (six) hours as needed for pain.   15 tablet   0     BP 149/103  Pulse 97  Temp(Src) 97.5 F (36.4 C)  Resp 18  Ht 6\' 3"  (1.905 m)  Wt 187  lb (84.823 kg)  BMI 23.37 kg/m2  SpO2 96%  Physical Exam  Nursing note and vitals reviewed. Constitutional: He is oriented to person, place, and time. He appears well-developed and well-nourished.  Non-toxic appearance.  HENT:  Head: Normocephalic.  Right Ear: Tympanic membrane and external ear normal.  Left Ear: Tympanic membrane and external ear normal.  Eyes: EOM and lids are normal. Pupils are equal, round, and reactive to light.  Neck: Normal range of motion. Neck supple. Carotid bruit is not present.  Cardiovascular: Regular rhythm, normal heart sounds, intact distal pulses and normal pulses.  Tachycardia present.   Pulmonary/Chest: Breath sounds normal. No respiratory distress.  Abdominal: Soft. Bowel sounds are normal. There is no tenderness. There is no guarding.  Musculoskeletal: Normal range of motion.  Lymphadenopathy:       Head  (right side): No submandibular adenopathy present.       Head (left side): No submandibular adenopathy present.    He has no cervical adenopathy.  Neurological: He is alert and oriented to person, place, and time. He has normal strength. No cranial nerve deficit or sensory deficit.  Skin: Skin is warm and dry.  3 raised red areas of the right chin and lower cheek. No drainage or red streaking. Sore to palpation.  Psychiatric: His speech is normal. His mood appears anxious.    ED Course  Procedures (including critical care time)  Labs Reviewed - No data to display No results found.   No diagnosis found.    MDM  I have reviewed nursing notes, vital signs, and all appropriate lab and imaging results for this patient. Pt has 3 early abscess areas of the right lower cheek and chin. Plan, Rx for amoxil and doxycycline. Pt to see his primary MD for follow up and recheck.       Kathie Dike, PA-C 09/29/12 1011

## 2012-09-29 NOTE — ED Provider Notes (Signed)
Medical screening examination/treatment/procedure(s) were performed by non-physician practitioner and as supervising physician I was immediately available for consultation/collaboration.   Joya Gaskins, MD 09/29/12 1028

## 2013-02-07 ENCOUNTER — Encounter (HOSPITAL_COMMUNITY): Payer: Self-pay | Admitting: Emergency Medicine

## 2013-02-07 ENCOUNTER — Emergency Department (HOSPITAL_COMMUNITY)
Admission: EM | Admit: 2013-02-07 | Discharge: 2013-02-07 | Disposition: A | Discharge status: Home / Self Care | Payer: Medicaid Other | Attending: Emergency Medicine | Admitting: Emergency Medicine

## 2013-02-07 DIAGNOSIS — Z8739 Personal history of other diseases of the musculoskeletal system and connective tissue: Secondary | ICD-10-CM | POA: Insufficient documentation

## 2013-02-07 DIAGNOSIS — G8929 Other chronic pain: Secondary | ICD-10-CM | POA: Insufficient documentation

## 2013-02-07 DIAGNOSIS — Z8659 Personal history of other mental and behavioral disorders: Secondary | ICD-10-CM | POA: Insufficient documentation

## 2013-02-07 DIAGNOSIS — M2550 Pain in unspecified joint: Secondary | ICD-10-CM | POA: Insufficient documentation

## 2013-02-07 DIAGNOSIS — F17200 Nicotine dependence, unspecified, uncomplicated: Secondary | ICD-10-CM | POA: Insufficient documentation

## 2013-02-07 DIAGNOSIS — Z79899 Other long term (current) drug therapy: Secondary | ICD-10-CM | POA: Insufficient documentation

## 2013-02-07 DIAGNOSIS — R202 Paresthesia of skin: Secondary | ICD-10-CM

## 2013-02-07 DIAGNOSIS — R209 Unspecified disturbances of skin sensation: Secondary | ICD-10-CM | POA: Insufficient documentation

## 2013-02-07 DIAGNOSIS — I10 Essential (primary) hypertension: Secondary | ICD-10-CM | POA: Insufficient documentation

## 2013-02-07 DIAGNOSIS — F319 Bipolar disorder, unspecified: Secondary | ICD-10-CM | POA: Insufficient documentation

## 2013-02-07 DIAGNOSIS — M549 Dorsalgia, unspecified: Secondary | ICD-10-CM | POA: Insufficient documentation

## 2013-02-07 DIAGNOSIS — Z7982 Long term (current) use of aspirin: Secondary | ICD-10-CM | POA: Insufficient documentation

## 2013-02-07 NOTE — ED Provider Notes (Signed)
History    CSN: [id number] Arrival date & time 02/07/13  1109  First MD Initiated Contact with Patient 02/07/13 1217     Chief Complaint  Patient presents with  . Tingling   (Consider location/radiation/quality/duration/timing/severity/associated sxs/prior Treatment) HPI Comments: Patient presents to the emergency department with complaint of tingling in the right arm, and tingling in the left lower back and leg. The patient states he's had this for a" long time". He says that it seems to have been acting worse in the last 8-9 days. She states that he has chronic back and neck problems with" pinched nerves". He was coming to the emergency department with his girlfriend's daughter and thought he would be evaluated in the emergency department. He is seen by one of the local internal medicine specialist, but has not been to the office recently.  The history is provided by the patient.   Past Medical History  Diagnosis Date  . Bipolar affective   . Hypertension   . DDD (degenerative disc disease), lumbar   . Back pain   . Paranoia    Past Surgical History  Procedure Laterality Date  . Spleen  spleen removed  . Splenectomy     History reviewed. No pertinent family history. History  Substance Use Topics  . Smoking status: Current Every Day Smoker -- 0.50 packs/day    Types: Cigarettes  . Smokeless tobacco: Not on file  . Alcohol Use: Yes     Comment: occasionally    Review of Systems  Constitutional: Negative for activity change.       All ROS Neg except as noted in HPI  HENT: Negative for nosebleeds and neck pain.   Eyes: Negative for photophobia and discharge.  Respiratory: Negative for cough, shortness of breath and wheezing.   Cardiovascular: Negative for chest pain and palpitations.  Gastrointestinal: Negative for abdominal pain and blood in stool.  Genitourinary: Negative for dysuria, frequency and hematuria.  Musculoskeletal: Positive for back pain and arthralgias.   Skin: Negative.   Neurological: Positive for numbness. Negative for dizziness, seizures and speech difficulty.  Psychiatric/Behavioral: Negative for hallucinations and confusion.       Bipolar illness    Allergies  Review of patient's allergies indicates no known allergies.  Home Medications   Current Outpatient Rx  Name  Route  Sig  Dispense  Refill  . amoxicillin (AMOXIL) 500 MG capsule   Oral   Take 1 capsule (500 mg total) by mouth 3 (three) times daily.   21 capsule   0   . Aspirin-Salicylamide-Caffeine (BC HEADACHE POWDER PO)   Oral   Take 1 packet by mouth daily as needed (for headaches.).         Marland Kitchen doxycycline (VIBRAMYCIN) 100 MG capsule   Oral   Take 1 capsule (100 mg total) by mouth 2 (two) times daily.   20 capsule   0   . gabapentin (NEURONTIN) 300 MG capsule   Oral   Take 300 mg by mouth 3 (three) times daily.           . methocarbamol (ROBAXIN) 500 MG tablet   Oral   Take 1 tablet (500 mg total) by mouth 2 (two) times daily.   20 tablet   0   . thiothixene (NAVANE) 5 MG capsule   Oral   Take 5 mg by mouth at bedtime and may repeat dose one time if needed.           . traMADol (ULTRAM) 50  MG tablet   Oral   Take 1 tablet (50 mg total) by mouth every 6 (six) hours as needed for pain.   15 tablet   0    BP 155/113  Pulse 91  Temp(Src) 97.9 F (36.6 C) (Oral)  Resp 18  SpO2 100% Physical Exam  Nursing note and vitals reviewed. Constitutional: He is oriented to person, place, and time. He appears well-developed and well-nourished.  Non-toxic appearance.  HENT:  Head: Normocephalic.  Right Ear: Tympanic membrane and external ear normal.  Left Ear: Tympanic membrane and external ear normal.  Eyes: EOM and lids are normal. Pupils are equal, round, and reactive to light.  Neck: Normal range of motion. Neck supple. Carotid bruit is not present.  Cardiovascular: Normal rate, regular rhythm, normal heart sounds, intact distal pulses and  normal pulses.   Pulmonary/Chest: Breath sounds normal. No respiratory distress.  Abdominal: Soft. Bowel sounds are normal. There is no tenderness. There is no guarding.  Musculoskeletal: Normal range of motion.  Patient has soreness of the lower cervical spine area. There is soreness with attempted range of motion of the right shoulder. There no hot joints appreciated of the upper extremities. There's no deformity of the upper extremities. There is no palpable step off of the cervical spine.  There is no palpable step off of the lumbar spine. There is mild paraspinal area spasm. There no hot areas appreciated. There is full range of motion of the right hip knee ankle and toes. The dorsalis pedis pulses 2+ bilaterally, dorsalis pedis pulse is also 2+ bilaterally.  Lymphadenopathy:       Head (right side): No submandibular adenopathy present.       Head (left side): No submandibular adenopathy present.    He has no cervical adenopathy.  Neurological: He is alert and oriented to person, place, and time. He has normal strength. No cranial nerve deficit or sensory deficit.  Gait is steady and without problem.  Skin: Skin is warm and dry.  Psychiatric: He has a normal mood and affect. His speech is normal.    ED Course  Procedures (including critical care time) Labs Reviewed - No data to display No results found. 1. Tingling     MDM  **I have reviewed nursing notes, vital signs, and all appropriate lab and imaging results for this patient.* Patient presents to the emergency department with complaint of tingling and at times numb sensations in the lower back on extending into the left leg, and the right lower neck extending into the right arm. These are not new findings, the patient has been treated in the past by a pain management center, and is currently being treated by Dr. Delbert Harness for these problems.  No gross neurologic deficit appreciated at this time. Vital signs are within normal  limits with the exception of the blood pressure being 155/113.  Patient encouraged to see Dr. Delbert Harness for additional evaluation, and possible referral to a pain management concerning these problems. An injection of steroid was offered to the patient but declined.  Kathie Dike, PA-C 02/07/13 1230

## 2013-02-07 NOTE — ED Provider Notes (Signed)
Medical screening examination/treatment/procedure(s) were performed by non-physician practitioner and as supervising physician I was immediately available for consultation/collaboration.   Makyi Ledo L Veryl Abril, MD 02/07/13 1528 

## 2013-02-07 NOTE — ED Notes (Signed)
Pt c/o r arm tingling, across lower back radiating into left leg x 8-9 days. Pt has hs of chronic back pain/"pinched nerves". Pt states r arm tingling x 1 year intermittent also. Denies cp/pressure/tightness. Nad. Tingling worse with movement.

## 2013-02-12 ENCOUNTER — Encounter (HOSPITAL_COMMUNITY): Payer: Self-pay

## 2013-02-12 ENCOUNTER — Inpatient Hospital Stay (HOSPITAL_COMMUNITY)
Admission: EM | Admit: 2013-02-12 | Discharge: 2013-02-14 | DRG: 439 | Disposition: A | Discharge status: Home Health | Payer: Medicaid Other | Attending: Family Medicine | Admitting: Family Medicine

## 2013-02-12 ENCOUNTER — Emergency Department (HOSPITAL_COMMUNITY): Payer: Medicaid Other

## 2013-02-12 DIAGNOSIS — F10939 Alcohol use, unspecified with withdrawal, unspecified: Secondary | ICD-10-CM | POA: Diagnosis present

## 2013-02-12 DIAGNOSIS — F10239 Alcohol dependence with withdrawal, unspecified: Secondary | ICD-10-CM | POA: Diagnosis present

## 2013-02-12 DIAGNOSIS — K709 Alcoholic liver disease, unspecified: Secondary | ICD-10-CM | POA: Diagnosis present

## 2013-02-12 DIAGNOSIS — K7010 Alcoholic hepatitis without ascites: Secondary | ICD-10-CM | POA: Diagnosis present

## 2013-02-12 DIAGNOSIS — F1010 Alcohol abuse, uncomplicated: Secondary | ICD-10-CM

## 2013-02-12 DIAGNOSIS — F259 Schizoaffective disorder, unspecified: Secondary | ICD-10-CM

## 2013-02-12 DIAGNOSIS — IMO0001 Reserved for inherently not codable concepts without codable children: Secondary | ICD-10-CM | POA: Diagnosis present

## 2013-02-12 DIAGNOSIS — E860 Dehydration: Secondary | ICD-10-CM | POA: Diagnosis present

## 2013-02-12 DIAGNOSIS — I10 Essential (primary) hypertension: Secondary | ICD-10-CM | POA: Diagnosis present

## 2013-02-12 DIAGNOSIS — I959 Hypotension, unspecified: Secondary | ICD-10-CM | POA: Diagnosis present

## 2013-02-12 DIAGNOSIS — M51379 Other intervertebral disc degeneration, lumbosacral region without mention of lumbar back pain or lower extremity pain: Secondary | ICD-10-CM | POA: Diagnosis present

## 2013-02-12 DIAGNOSIS — B880 Other acariasis: Secondary | ICD-10-CM | POA: Diagnosis present

## 2013-02-12 DIAGNOSIS — Z9089 Acquired absence of other organs: Secondary | ICD-10-CM

## 2013-02-12 DIAGNOSIS — M5137 Other intervertebral disc degeneration, lumbosacral region: Secondary | ICD-10-CM | POA: Diagnosis present

## 2013-02-12 DIAGNOSIS — K861 Other chronic pancreatitis: Secondary | ICD-10-CM

## 2013-02-12 DIAGNOSIS — Z23 Encounter for immunization: Secondary | ICD-10-CM

## 2013-02-12 DIAGNOSIS — F319 Bipolar disorder, unspecified: Secondary | ICD-10-CM | POA: Diagnosis present

## 2013-02-12 DIAGNOSIS — M519 Unspecified thoracic, thoracolumbar and lumbosacral intervertebral disc disorder: Secondary | ICD-10-CM

## 2013-02-12 DIAGNOSIS — F1020 Alcohol dependence, uncomplicated: Secondary | ICD-10-CM | POA: Diagnosis present

## 2013-02-12 DIAGNOSIS — K859 Acute pancreatitis without necrosis or infection, unspecified: Principal | ICD-10-CM

## 2013-02-12 DIAGNOSIS — F17200 Nicotine dependence, unspecified, uncomplicated: Secondary | ICD-10-CM | POA: Diagnosis present

## 2013-02-12 LAB — URINALYSIS, ROUTINE W REFLEX MICROSCOPIC
Glucose, UA: NEGATIVE mg/dL
Leukocytes, UA: NEGATIVE
Protein, ur: 100 mg/dL — AB
Urobilinogen, UA: 1 mg/dL (ref 0.0–1.0)

## 2013-02-12 LAB — APTT: aPTT: 35 s (ref 24–37)

## 2013-02-12 LAB — COMPREHENSIVE METABOLIC PANEL
AST: 159 U/L — ABNORMAL HIGH (ref 0–37)
BUN: 12 mg/dL (ref 6–23)
CO2: 25 mEq/L (ref 19–32)
Calcium: 9.4 mg/dL (ref 8.4–10.5)
Chloride: 91 mEq/L — ABNORMAL LOW (ref 96–112)
Creatinine, Ser: 1.41 mg/dL — ABNORMAL HIGH (ref 0.50–1.35)
GFR calc Af Amer: 64 mL/min — ABNORMAL LOW (ref >90)
GFR calc non Af Amer: 55 mL/min — ABNORMAL LOW (ref >90)
Glucose, Bld: 129 mg/dL — ABNORMAL HIGH (ref 70–99)
Total Bilirubin: 2 mg/dL — ABNORMAL HIGH (ref 0.3–1.2)

## 2013-02-12 LAB — CBC WITH DIFFERENTIAL/PLATELET
Basophils Absolute: 0 10*3/uL (ref 0.0–0.1)
Eosinophils Relative: 0 % (ref 0–5)
HCT: 45.4 % (ref 39.0–52.0)
Hemoglobin: 16.9 g/dL (ref 13.0–17.0)
Lymphocytes Relative: 13 % (ref 12–46)
Lymphs Abs: 2.5 10*3/uL (ref 0.7–4.0)
MCV: 98.5 fL (ref 78.0–100.0)
Monocytes Absolute: 1.6 10*3/uL — ABNORMAL HIGH (ref 0.1–1.0)
Monocytes Relative: 9 % (ref 3–12)
Neutro Abs: 14.4 10*3/uL — ABNORMAL HIGH (ref 1.7–7.7)
RDW: 13.3 % (ref 11.5–15.5)
WBC: 18.6 10*3/uL — ABNORMAL HIGH (ref 4.0–10.5)

## 2013-02-12 LAB — URINE MICROSCOPIC-ADD ON

## 2013-02-12 LAB — PROTIME-INR
INR: 0.95 (ref 0.00–1.49)
Prothrombin Time: 12.5 seconds (ref 11.6–15.2)

## 2013-02-12 LAB — LIPASE, BLOOD: Lipase: 59 U/L (ref 11–59)

## 2013-02-12 MED ORDER — MORPHINE SULFATE 4 MG/ML IJ SOLN
4.0000 mg | Freq: Once | INTRAMUSCULAR | Status: AC
  Administered 2013-02-12: 4 mg via INTRAVENOUS
  Filled 2013-02-12: qty 1

## 2013-02-12 MED ORDER — POTASSIUM CHLORIDE CRYS ER 20 MEQ PO TBCR
40.0000 meq | EXTENDED_RELEASE_TABLET | Freq: Once | ORAL | Status: AC
  Administered 2013-02-12: 40 meq via ORAL
  Filled 2013-02-12: qty 2

## 2013-02-12 MED ORDER — FOLIC ACID 1 MG PO TABS
1.0000 mg | ORAL_TABLET | Freq: Every day | ORAL | Status: DC
  Administered 2013-02-12 – 2013-02-14 (×3): 1 mg via ORAL
  Filled 2013-02-12 (×3): qty 1

## 2013-02-12 MED ORDER — LORAZEPAM 2 MG/ML IJ SOLN: 0.0000 mg | Freq: Two times a day (BID) | INTRAMUSCULAR | Status: DC

## 2013-02-12 MED ORDER — ONDANSETRON HCL 4 MG/2ML IJ SOLN
4.0000 mg | Freq: Once | INTRAMUSCULAR | Status: AC
  Administered 2013-02-12: 4 mg via INTRAVENOUS
  Filled 2013-02-12: qty 2

## 2013-02-12 MED ORDER — IOHEXOL 300 MG/ML  SOLN
100.0000 mL | Freq: Once | INTRAMUSCULAR | Status: AC | PRN
  Administered 2013-02-12: 100 mL via INTRAVENOUS

## 2013-02-12 MED ORDER — SODIUM CHLORIDE 0.9 % IJ SOLN
3.0000 mL | Freq: Two times a day (BID) | INTRAMUSCULAR | Status: DC
  Administered 2013-02-12: 3 mL via INTRAVENOUS

## 2013-02-12 MED ORDER — ADULT MULTIVITAMIN W/MINERALS CH
1.0000 | ORAL_TABLET | Freq: Every day | ORAL | Status: DC
  Administered 2013-02-12 – 2013-02-14 (×3): 1 via ORAL
  Filled 2013-02-12 (×3): qty 1

## 2013-02-12 MED ORDER — LORAZEPAM 2 MG/ML IJ SOLN
0.0000 mg | Freq: Four times a day (QID) | INTRAMUSCULAR | Status: DC
  Administered 2013-02-13 (×2): 1 mg via INTRAVENOUS
  Filled 2013-02-12 (×2): qty 1

## 2013-02-12 MED ORDER — LORAZEPAM 2 MG/ML IJ SOLN: 1.0000 mg | Freq: Four times a day (QID) | INTRAMUSCULAR | Status: DC | PRN

## 2013-02-12 MED ORDER — SODIUM CHLORIDE 0.9 % IV BOLUS (SEPSIS)
500.0000 mL | Freq: Once | INTRAVENOUS | Status: AC
  Administered 2013-02-12: 500 mL via INTRAVENOUS

## 2013-02-12 MED ORDER — IOHEXOL 300 MG/ML  SOLN
50.0000 mL | Freq: Once | INTRAMUSCULAR | Status: AC | PRN
  Administered 2013-02-12: 50 mL via ORAL

## 2013-02-12 MED ORDER — ONDANSETRON HCL 4 MG/2ML IJ SOLN: 4.0000 mg | INTRAMUSCULAR | Status: DC | PRN

## 2013-02-12 MED ORDER — HALOPERIDOL LACTATE 5 MG/ML IJ SOLN
2.0000 mg | Freq: Four times a day (QID) | INTRAMUSCULAR | Status: DC
  Administered 2013-02-12 – 2013-02-13 (×4): 2 mg via INTRAVENOUS
  Filled 2013-02-12 (×7): qty 1

## 2013-02-12 MED ORDER — FLEET ENEMA 7-19 GM/118ML RE ENEM: 1.0000 | ENEMA | Freq: Once | RECTAL | Status: AC | PRN

## 2013-02-12 MED ORDER — HYDROMORPHONE HCL PF 1 MG/ML IJ SOLN
1.0000 mg | INTRAMUSCULAR | Status: DC | PRN
  Administered 2013-02-12 – 2013-02-14 (×5): 1 mg via INTRAVENOUS
  Filled 2013-02-12 (×5): qty 1

## 2013-02-12 MED ORDER — THIAMINE HCL 100 MG/ML IJ SOLN: 100.0000 mg | Freq: Every day | INTRAMUSCULAR | Status: DC

## 2013-02-12 MED ORDER — POTASSIUM CHLORIDE IN NACL 20-0.9 MEQ/L-% IV SOLN
INTRAVENOUS | Status: DC
  Administered 2013-02-12 – 2013-02-14 (×5): via INTRAVENOUS

## 2013-02-12 MED ORDER — ENOXAPARIN SODIUM 40 MG/0.4ML ~~LOC~~ SOLN
40.0000 mg | SUBCUTANEOUS | Status: DC
  Administered 2013-02-12 – 2013-02-13 (×2): 40 mg via SUBCUTANEOUS
  Filled 2013-02-12 (×2): qty 0.4

## 2013-02-12 MED ORDER — SODIUM CHLORIDE 0.9 % IV BOLUS (SEPSIS)
1000.0000 mL | Freq: Once | INTRAVENOUS | Status: AC
  Administered 2013-02-12: 1000 mL via INTRAVENOUS

## 2013-02-12 MED ORDER — VITAMIN B-1 100 MG PO TABS
100.0000 mg | ORAL_TABLET | Freq: Every day | ORAL | Status: DC
  Administered 2013-02-12 – 2013-02-14 (×3): 100 mg via ORAL
  Filled 2013-02-12 (×3): qty 1

## 2013-02-12 MED ORDER — LORAZEPAM 1 MG PO TABS: 1.0000 mg | ORAL_TABLET | Freq: Four times a day (QID) | ORAL | Status: DC | PRN

## 2013-02-12 NOTE — Progress Notes (Signed)
Pt came up from ED. Pt in NAD, hospitalist paged about pt's arrival. Will continue to monitor.

## 2013-02-12 NOTE — ED Notes (Signed)
Pt to nurses desk wanting to gp out to smoke. Pt made aware he could not go out to smoke. States he would just leave if he could not go out to smoke. Pt and family back to room.

## 2013-02-12 NOTE — ED Notes (Signed)
edp notified of pt's bp, orders received.  bp taken after pt got up to use restroom.  Pt asymptomatic, denies dizziness/lightheadedness.  nad noted.

## 2013-02-12 NOTE — ED Notes (Signed)
Complain of abdominal pain . States he was here Sunday for the same. States that little bald headed man would not give him anything

## 2013-02-12 NOTE — H&P (Signed)
[facility] History and Physical  Kirk Harper  AVW:[id number]  DOB: [birth year]   DOA: 02/12/2013   PCP:   Isabella Stalling, MD  Psychiatrist: [facility]  Chief Complaint:  Abdominal pain for one week  HPI: Kirk Harper is a 52 y.o. male.  Middle-aged Caucasian gentleman with a history of schizoaffective disorder, alcohol abuse and chronic pancreatitis, presents with a one-week history of nausea vomiting and abdominal pain after a binge one week ago. Denies blood in his vomitus or stool but says his stool is been black for the past 5 days. I reports that she drinks wine, beer, and liquo;, he gives the amounts as, "a bunch"  He is been vomiting about 3-4 times a day and having 2-5 loose stools per day  He reports he typically goes into withdrawal about 6-7 hours after not drinking, but says he has not drunk since yesterday  Rewiew of Systems:   All systems negative except as marked bold or noted in the HPI;  Constitutional:    malaise, fever and chills. ;  Eyes:   eye pain, redness and discharge. ;  ENMT:   ear pain, hoarseness, nasal congestion, sinus pressure and sore throat. ;  Cardiovascular:    chest pain, palpitations, diaphoresis, dyspnea and peripheral edema.  Respiratory:   cough, hemoptysis, wheezing and stridor. ;  Gastrointestinal:  nausea, vomiting, diarrhea, constipation, abdominal pain, melena x 5 days, blood in stool, hematemesis, jaundice and rectal bleeding. unusual weight loss..   Genitourinary:    frequency, dysuria, incontinence,flank pain and hematuria; Musculoskeletal:   back pain and neck pain.  swelling and trauma.;  Skin: .  pruritus, rash, abrasions, bruising and skin lesion.; ulcerations Neuro:    headache, lightheadedness and neck stiffness.  weakness, altered level of consciousness, altered mental status, extremity weakness, tingling burning feet, involuntary movement, seizure and syncope.  Psych:    anxiety, depression, insomnia,  tearfulness, panic attacks, hallucinations, paranoia, suicidal or homicidal ideation    Past Medical History  Diagnosis Date  . Bipolar affective   . Hypertension   . DDD (degenerative disc disease), lumbar   . Back pain   . Paranoia     Past Surgical History  Procedure Laterality Date  . Spleen  spleen removed  . Splenectomy      Medications:  HOME MEDS: Prior to Admission medications   Medication Sig Start Date End Date Taking? Authorizing Provider  Aspirin-Salicylamide-Caffeine (BC HEADACHE POWDER PO) Take 1 packet by mouth daily as needed (for headaches.).   Yes Historical Provider, MD  lisinopril-hydrochlorothiazide (PRINZIDE,ZESTORETIC) 20-12.5 MG per tablet Take 1 tablet by mouth daily.   Yes Historical Provider, MD  traZODone (DESYREL) 150 MG tablet Take 150 mg by mouth at bedtime.   Yes Historical Provider, MD     Allergies:  No Known Allergies  Social History:   reports that he has been smoking Cigarettes.  He has been smoking about 0.50 packs per day. He does not have any smokeless tobacco history on file. He reports that  drinks alcohol. He reports that he does not use illicit drugs.  Family History: No family history on file.   Physical Exam: Filed Vitals:   02/12/13 1340 02/12/13 1343 02/12/13 1755 02/12/13 1809  BP:  99/84 88/64 89/70   Pulse:  121 90 54  Temp:  97.3 F (36.3 C)    TempSrc:  Oral    Resp:  20 20 16   Height: 6\' 3"  (1.905 m)     Weight: 84.369  kg (186 lb)     SpO2:  99% 95% 95%   Blood pressure 89/70, pulse 54, temperature 97.3 F (36.3 C), temperature source Oral, resp. rate 16, height 6\' 3"  (1.905 m), weight 84.369 kg (186 lb), SpO2 95.00%. Body mass index is 23.25 kg/(m^2).   GEN:  Pleasant nontoxic-appearing middle-aged Caucasian gentleman lying bed in no acute distress; cooperative with exam PSYCH:  alert and oriented x4; neither anxious nor depressed; affect is appropriate. HEENT: Mucous membranes pink, dry and mild; PERRLA;  EOM intact; no cervical lymphadenopathy nor thyromegaly or carotid bruit; no JVD; Breasts:: Not examined CHEST WALL: No tenderness CHEST: Normal respiration, clear to auscultation bilaterally HEART: Regular rate and rhythm; no murmurs rubs or gallops BACK: No kyphosis no scoliosis; no CVA tenderness ABDOMEN: Obese, diffuse abdominal tenderness no guarding; no masses, no organomegaly, normal abdominal bowel sounds; no pannus; no intertriginous candida. Rectal Exam: Not done EXTREMITIES:  age-appropriate arthropathy of the hands and knees; no edema; no ulcerations. Genitalia: not examined PULSES: 2+ and symmetric SKIN: Papular urticaria of arms and legs, areas of his back; says this is due to chiggers CNS: Cranial nerves 2-12 grossly intact no focal lateralizing neurologic deficit   Labs on Admission:  Basic Metabolic Panel:  Recent Labs Lab 02/12/13 1415  NA 128*  K 3.3*  CL 91*  CO2 25  GLUCOSE 129*  BUN 12  CREATININE 1.41*  CALCIUM 9.4  MG 1.6   Liver Function Tests:  Recent Labs Lab 02/12/13 1415  AST 159*  ALT 89*  ALKPHOS 139*  BILITOT 2.0*  PROT 7.8  ALBUMIN 3.6    Recent Labs Lab 02/12/13 1415  LIPASE 59   No results found for this basename: AMMONIA,  in the last 168 hours CBC:  Recent Labs Lab 02/12/13 1415  WBC 18.6*  NEUTROABS 14.4*  HGB 16.9  HCT 45.4  MCV 98.5  PLT 134*   Cardiac Enzymes: No results found for this basename: CKTOTAL, CKMB, CKMBINDEX, TROPONINI,  in the last 168 hours BNP: No components found with this basename: POCBNP,  D-dimer: No components found with this basename: D-DIMER,  CBG: No results found for this basename: GLUCAP,  in the last 168 hours  Radiological Exams on Admission: Ct Abdomen Pelvis W Contrast  02/12/2013   *RADIOLOGY REPORT*  Clinical Data: Epigastric pain  CT ABDOMEN AND PELVIS WITH CONTRAST  Technique:  Multidetector CT imaging of the abdomen and pelvis was performed following the standard protocol  during bolus administration of intravenous contrast.  Contrast: 50mL OMNIPAQUE IOHEXOL 300 MG/ML  SOLN, OMNIPAQUE IOHEXOL 300 MG/ML  SOLN  Comparison: None.  Findings: Diffuse hepatic steatosis.  A single calcified gallstone is present.  No obvious inflammatory change in the gallbladder region.  The calcification within the region of the tail the pancreas is stable.  There is now stranding about the tail of the pancreas. Pancreatic body enhances.  No evidence of pancreatic hemorrhage. No obvious pseudocyst.  The spleen is absent.  Kidneys and adrenal glands are within normal limits.  Bladder and prostate are within normal limits  No free fluid.  No obvious abnormal adenopathy  Atherosclerotic changes of the aorta are noted.  Small inflammatory nodes in the gastrohepatic ligament.  Moderate lumbar degenerative disc disease.  Transitional anatomy at the lumbosacral junction.  IMPRESSION: Findings consistent with acute pancreatitis in the tail of the pancreas.  Cholelithiasis.   Original Report Authenticated By: Jolaine Click, M.D.     Assessment/Plan   Active Problems:  Pancreatitis, acute [acute on chronic]   Chronic pancreatitis   Dehydration   Schizoaffective disorder   Alcohol abuse   Alcoholic liver disease   Lumbar disc disease   Chigger infestation    PLAN: We'll admit this gentleman for hydration and pain control; We'll initially keep him n.p.o. to rest his pancreas  He reports he takes Thorazine; will give him scheduled empiric dose of Haldol while he is n.p.o., until we get the correct dose of his Thorazine.  The place him a alcohol withdrawal protocol   Other plans as per orders.  Code Status: Full code Family Communication:  Plans discuss with patient and fianc at bedside Disposition Plan: Likely home in a few days    Adarsh Mundorf Nocturnist [facility] Pager [phone]   02/12/2013, 8:27 PM

## 2013-02-12 NOTE — ED Provider Notes (Signed)
CSN: [id number]     Arrival date & time 02/12/13  1324 History  This chart was scribed for Enid Skeens, MD by Bennett Scrape, ED Scribe. This patient was seen in room APA01/APA01 and the patient's care was started at 1:57 PM.   First MD Initiated Contact with Patient 02/12/13 1346    Chief Complaint  Patient presents with  . Abdominal Pain    The history is provided by the patient. No language interpreter was used.   HPI Comments: Kirk Harper is a 52 y.o. male who presents to the Emergency Department complaining of 7 to 8 days of persistent severe emesis described as phlegm with associated diffuse myalgias described as cramping. The emesis is worse after eating and drinking. He denies any known food triggers such as greasy or spicy foods. He reports thtat he has experienced melena described as black stool for the past week as well. He denies having a h/o ulcers and denies being on any anticoagulants. He denies being evaluated by a GI specialist or having a prior endoscopy. He denies hematemesis, HAs, visual changes, sore throat, leg swelling as associated symptoms. 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 He is a smoker and former alcohol user. He reports consuming 6 to 7 beers daily for the past 40 years.    Past Medical History  Diagnosis Date  . Bipolar affective   . Hypertension   . DDD (degenerative disc disease), lumbar   . Back pain   . Paranoia    Past Surgical History  Procedure Laterality Date  . Spleen  spleen removed  . Splenectomy     No family history on file. History  Substance Use Topics  . Smoking status: Current Every Day Smoker -- 0.50 packs/day    Types: Cigarettes  . Smokeless tobacco: Not on file  . Alcohol Use: Yes     Comment: occasionally    Review of Systems  Constitutional: Positive for chills. Negative for fever.  HENT: Negative for neck pain and neck stiffness.   Eyes: Negative for visual disturbance.  Respiratory: Negative  for shortness of breath.   Cardiovascular: Negative for chest pain.  Gastrointestinal: Positive for nausea, vomiting and abdominal pain. Negative for diarrhea.  Genitourinary: Negative for hematuria.  Musculoskeletal: Positive for myalgias.  Skin: Negative for rash.  Neurological: Negative for headaches.  All other systems reviewed and are negative.    Allergies  Review of patient's allergies indicates no known allergies.  Home Medications   Current Outpatient Rx  Name  Route  Sig  Dispense  Refill  . amoxicillin (AMOXIL) 500 MG capsule   Oral   Take 1 capsule (500 mg total) by mouth 3 (three) times daily.   21 capsule   0   . Aspirin-Salicylamide-Caffeine (BC HEADACHE POWDER PO)   Oral   Take 1 packet by mouth daily as needed (for headaches.).         Marland Kitchen doxycycline (VIBRAMYCIN) 100 MG capsule   Oral   Take 1 capsule (100 mg total) by mouth 2 (two) times daily.   20 capsule   0   . gabapentin (NEURONTIN) 300 MG capsule   Oral   Take 300 mg by mouth 3 (three) times daily.           . methocarbamol (ROBAXIN) 500 MG tablet   Oral   Take 1 tablet (500 mg total) by mouth 2 (two) times daily.   20 tablet   0   .  thiothixene (NAVANE) 5 MG capsule   Oral   Take 5 mg by mouth at bedtime and may repeat dose one time if needed.           . traMADol (ULTRAM) 50 MG tablet   Oral   Take 1 tablet (50 mg total) by mouth every 6 (six) hours as needed for pain.   15 tablet   0    Triage Vitals: BP 99/84  Pulse 121  Temp(Src) 97.3 F (36.3 C) (Oral)  Resp 20  Ht 6\' 3"  (1.905 m)  Wt 186 lb (84.369 kg)  BMI 23.25 kg/m2  SpO2 99%  Physical Exam  Nursing note and vitals reviewed. Constitutional: He is oriented to person, place, and time. He appears well-developed and well-nourished. No distress.  HENT:  Head: Normocephalic and atraumatic.  Dry mm  Eyes: Conjunctivae and EOM are normal.  Neck: Neck supple. No tracheal deviation present.  Cardiovascular: Normal  rate and regular rhythm.   No murmur heard. Pulmonary/Chest: Effort normal and breath sounds normal. No respiratory distress. He has no wheezes.  Abdominal: Soft. Bowel sounds are normal. He exhibits no distension. There is tenderness.  Genitourinary: Guaiac negative stool.  No signs of hemorrhoids, chaperone present  Musculoskeletal: Normal range of motion. He exhibits no edema (no ankle swelling).  Neurological: He is alert and oriented to person, place, and time. No cranial nerve deficit.  Skin: Skin is warm and dry. No rash noted.  Psychiatric: He has a normal mood and affect. His behavior is normal.    ED Course   Procedures (including critical care time)  Medications  sodium chloride 0.9 % bolus 1,000 mL (not administered)  morphine 4 MG/ML injection 4 mg (not administered)  ondansetron (ZOFRAN) injection 4 mg (not administered)   Labs Reviewed  CBC WITH DIFFERENTIAL - Abnormal; Notable for the following:    WBC 18.6 (*)    MCH 36.7 (*)    MCHC 37.2 (*)    Platelets 134 (*)    Neutrophils Relative % 78 (*)    Neutro Abs 14.4 (*)    Monocytes Absolute 1.6 (*)    All other components within normal limits  COMPREHENSIVE METABOLIC PANEL - Abnormal; Notable for the following:    Sodium 128 (*)    Potassium 3.3 (*)    Chloride 91 (*)    Glucose, Bld 129 (*)    Creatinine, Ser 1.41 (*)    AST 159 (*)    ALT 89 (*)    Alkaline Phosphatase 139 (*)    Total Bilirubin 2.0 (*)    GFR calc non Af Amer 55 (*)    GFR calc Af Amer 64 (*)    All other components within normal limits  URINALYSIS, ROUTINE W REFLEX MICROSCOPIC - Abnormal; Notable for the following:    Color, Urine AMBER (*)    Specific Gravity, Urine >1.030 (*)    Bilirubin Urine MODERATE (*)    Ketones, ur TRACE (*)    Protein, ur 100 (*)    Nitrite POSITIVE (*)    All other components within normal limits  URINE MICROSCOPIC-ADD ON - Abnormal; Notable for the following:    Squamous Epithelial / LPF FEW (*)     Bacteria, UA FEW (*)    Casts HYALINE CASTS (*)    All other components within normal limits  CBC - Abnormal; Notable for the following:    WBC 15.7 (*)    RBC 3.91 (*)    MCV 101.3 (*)  MCH 35.8 (*)    Platelets 128 (*)    All other components within normal limits  COMPREHENSIVE METABOLIC PANEL - Abnormal; Notable for the following:    Albumin 2.8 (*)    AST 96 (*)    ALT 58 (*)    Alkaline Phosphatase 125 (*)    Total Bilirubin 1.3 (*)    All other components within normal limits  LACTIC ACID, PLASMA  MAGNESIUM  LIPASE, BLOOD  PROTIME-INR  TSH  HEMOGLOBIN A1C  APTT  MAGNESIUM  BASIC METABOLIC PANEL  CBC  HEPATIC FUNCTION PANEL  LIPASE, BLOOD  OCCULT BLOOD, POC DEVICE    DIAGNOSTIC STUDIES: Oxygen Saturation is 99% on room air, normal by my interpretation.   Ct Abdomen Pelvis W Contrast  02/12/2013   *RADIOLOGY REPORT*  Clinical Data: Epigastric pain  CT ABDOMEN AND PELVIS WITH CONTRAST  Technique:  Multidetector CT imaging of the abdomen and pelvis was performed following the standard protocol during bolus administration of intravenous contrast.  Contrast: 50mL OMNIPAQUE IOHEXOL 300 MG/ML  SOLN, OMNIPAQUE IOHEXOL 300 MG/ML  SOLN  Comparison: None.  Findings: Diffuse hepatic steatosis.  A single calcified gallstone is present.  No obvious inflammatory change in the gallbladder region.  The calcification within the region of the tail the pancreas is stable.  There is now stranding about the tail of the pancreas. Pancreatic body enhances.  No evidence of pancreatic hemorrhage. No obvious pseudocyst.  The spleen is absent.  Kidneys and adrenal glands are within normal limits.  Bladder and prostate are within normal limits  No free fluid.  No obvious abnormal adenopathy  Atherosclerotic changes of the aorta are noted.  Small inflammatory nodes in the gastrohepatic ligament.  Moderate lumbar degenerative disc disease.  Transitional anatomy at the lumbosacral junction.   IMPRESSION: Findings consistent with acute pancreatitis in the tail of the pancreas.  Cholelithiasis.   Original Report Authenticated By: Jolaine Click, M.D.    COORDINATION OF CARE: 2:00 PM-Discussed rectal exam with pt and pt agreed. 2:02 PM-Discussed treatment plan which includes IV fluids, pain medication, antiemetic, CBC panel, CMP, UA with pt at bedside and pt agreed to plan. If normal, advised pt to follow up with GI for an endoscopy.   Labs Reviewed - No data to display No results found.  No diagnosis found.  MDM  I personally performed the services described in this documentation, which was scribed in my presence. The recorded information has been reviewed and is accurate.  Chronic etoh with hepatitis, pancreatitis, dehydration and hypotension. Pt wanted to leave, convinced to stay. Fluid boluses given. Spoke with hospitalist.  Admitted.    Enid Skeens, MD 02/13/13 2252

## 2013-02-13 LAB — CBC
MCH: 35.8 pg — ABNORMAL HIGH (ref 26.0–34.0)
MCHC: 35.4 g/dL (ref 30.0–36.0)
Platelets: 128 10*3/uL — ABNORMAL LOW (ref 150–400)
RDW: 13.6 % (ref 11.5–15.5)

## 2013-02-13 LAB — TSH: TSH: 1.328 u[IU]/mL (ref 0.350–4.500)

## 2013-02-13 LAB — COMPREHENSIVE METABOLIC PANEL
AST: 96 U/L — ABNORMAL HIGH (ref 0–37)
Albumin: 2.8 g/dL — ABNORMAL LOW (ref 3.5–5.2)
CO2: 28 mEq/L (ref 19–32)
Calcium: 8.7 mg/dL (ref 8.4–10.5)
Creatinine, Ser: 0.84 mg/dL (ref 0.50–1.35)
GFR calc non Af Amer: >90 mL/min (ref >90)
Total Protein: 6.5 g/dL (ref 6.0–8.3)

## 2013-02-13 LAB — HEMOGLOBIN A1C
Hgb A1c MFr Bld: 5.1 % (ref <5.7)
Mean Plasma Glucose: 100 mg/dL (ref <117)

## 2013-02-13 MED ORDER — NICOTINE 21 MG/24HR TD PT24
21.0000 mg | MEDICATED_PATCH | Freq: Every day | TRANSDERMAL | Status: DC
  Administered 2013-02-13 – 2013-02-14 (×2): 21 mg via TRANSDERMAL
  Filled 2013-02-13 (×2): qty 1

## 2013-02-13 NOTE — Progress Notes (Signed)
[id number] 

## 2013-02-14 LAB — BASIC METABOLIC PANEL
Chloride: 104 mEq/L (ref 96–112)
GFR calc non Af Amer: >90 mL/min (ref >90)
Glucose, Bld: 88 mg/dL (ref 70–99)
Potassium: 3.6 mEq/L (ref 3.5–5.1)
Sodium: 138 mEq/L (ref 135–145)

## 2013-02-14 LAB — HEPATIC FUNCTION PANEL
ALT: 48 U/L (ref 0–53)
Albumin: 2.7 g/dL — ABNORMAL LOW (ref 3.5–5.2)
Alkaline Phosphatase: 114 U/L (ref 39–117)
Indirect Bilirubin: 0.9 mg/dL (ref 0.3–0.9)
Total Protein: 6.4 g/dL (ref 6.0–8.3)

## 2013-02-14 LAB — CBC
Hemoglobin: 13.3 g/dL (ref 13.0–17.0)
MCH: 35.3 pg — ABNORMAL HIGH (ref 26.0–34.0)
RBC: 3.77 MIL/uL — ABNORMAL LOW (ref 4.22–5.81)

## 2013-02-14 MED ORDER — NICOTINE 21 MG/24HR TD PT24: 14.0000 | MEDICATED_PATCH | Freq: Every day | TRANSDERMAL | Status: DC

## 2013-02-14 MED ORDER — THIAMINE HCL 100 MG PO TABS: 100.0000 mg | ORAL_TABLET | Freq: Every day | ORAL | Status: DC

## 2013-02-14 MED ORDER — ADULT MULTIVITAMIN W/MINERALS CH: 1.0000 | ORAL_TABLET | Freq: Two times a day (BID) | ORAL | Status: DC

## 2013-02-14 MED ORDER — LORAZEPAM 1 MG PO TABS: 0.0000 mg | ORAL_TABLET | Freq: Four times a day (QID) | ORAL | Status: DC

## 2013-02-14 MED ORDER — LORAZEPAM 1 MG PO TABS: 0.0000 mg | ORAL_TABLET | Freq: Two times a day (BID) | ORAL | Status: DC

## 2013-02-14 MED ORDER — LORAZEPAM 1 MG PO TABS: 0.5000 mg | ORAL_TABLET | Freq: Four times a day (QID) | ORAL | Status: DC | PRN

## 2013-02-14 NOTE — Discharge Summary (Signed)
[id number] 
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
## 2013-02-14 NOTE — Progress Notes (Signed)
Pt is to be discharged home today. Pt is in NAD, IV is out, all paperwork has been reviewed/discussed with patient, and there are no questions/concerns at this time. Assessment is unchanged from this morning. Pt is to be accompanied downstairs by family via ambulation. Pt refused assistance from staff as well as a wheelchair.

## 2013-02-15 NOTE — Discharge Summary (Signed)
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  The patient likewise has COPD and hypertension for which he continues smoking was noncompliant with ACE inhibitor.  He was placed in the hospital given alcohol withdrawal protocol, thiamine, benzodiazepine, he seemed to do well, he wanted to go home.  His liver function tests were diminishing to near normal levels and his lipase remained normal in the hospital.  He subsequently felt that he could go home, but he was to continue Ativan and stay away from alcohol and take a nicotine patch for smoking cessation as well as his blood pressure medicine.  The patient and his girlfriend seemed to understand this and about ways to their intent to comply.  His renal function was normal in the hospital, his potassium was little low which was repleted normal on day of discharge, and he is to follow up in the office in 4-5 day's time for monitoring of liver function, lipase, electrolytes, and hemodynamics.     Melvyn Novas, MD     RMD/MEDQ  D:  02/14/2013  T:  02/14/2013  Job:  [id number]

## 2013-02-15 NOTE — Progress Notes (Signed)
NAME:  Kirk Harper, LAWRY NO.:  [id number]  MEDICAL RECORD NO.:  [id number]  LOCATION:                                 FACILITY:  PHYSICIAN:  Melvyn Novas, MDDATE OF BIRTH:  1961-04-10  DATE OF PROCEDURE:  02/13/2013 DATE OF DISCHARGE:  02/14/2013                                PROGRESS NOTE   The patient has schizoaffective disorder, COPD, ethanol hepatitis, as well as CT evidence of pancreatitis of the tail of the pancreas acute on chronic.  Lipase is 59 upper limits of normal.  His hemoglobin is 14, creatinine is 0.84.  The patient currently on a Ativan alcohol withdrawal protocol, given time and somewhat agitated this morning and wants to leave the hospital.  I cautioned against this.  Blood pressure is 108/67, temperature 98.6, pulse 68 and regular, respiratory rate is 20.  Lungs show prolonged expiratory phase and scattered rhonchi.  No rales, no wheeze.  Heart regular rhythm.  No murmurs, gallops, or rubs. CT shows evidence of pancreatitis at the tail of the pancreas.  PLAN:  Right now is to get hepatic profile, CBC, and BMET daily for 3 days.  Continue Ativan protocol and thiamine.  The patient given sedation and hopefully will stay and improved, and the patient cautioned not to drink alcohol.  He somewhat acknowledges this.     Melvyn Novas, MD     RMD/MEDQ  D:  02/13/2013  T:  02/13/2013  Job:  [id number]

## 2013-02-25 ENCOUNTER — Other Ambulatory Visit (HOSPITAL_COMMUNITY): Payer: Self-pay | Admitting: Family Medicine

## 2013-02-25 DIAGNOSIS — M79605 Pain in left leg: Secondary | ICD-10-CM

## 2013-03-01 ENCOUNTER — Ambulatory Visit (HOSPITAL_COMMUNITY)
Admission: RE | Admit: 2013-03-01 | Discharge: 2013-03-01 | Disposition: A | Discharge status: Home / Self Care | Payer: Medicaid Other | Source: Ambulatory Visit | Attending: Family Medicine | Admitting: Family Medicine

## 2013-03-01 DIAGNOSIS — M79605 Pain in left leg: Secondary | ICD-10-CM

## 2013-03-01 DIAGNOSIS — M47817 Spondylosis without myelopathy or radiculopathy, lumbosacral region: Secondary | ICD-10-CM | POA: Insufficient documentation

## 2013-03-01 DIAGNOSIS — M545 Low back pain, unspecified: Secondary | ICD-10-CM | POA: Insufficient documentation

## 2013-03-01 DIAGNOSIS — M5126 Other intervertebral disc displacement, lumbar region: Secondary | ICD-10-CM | POA: Insufficient documentation

## 2013-04-20 ENCOUNTER — Emergency Department (HOSPITAL_COMMUNITY): Payer: Medicaid Other

## 2013-04-20 ENCOUNTER — Emergency Department (HOSPITAL_COMMUNITY)
Admission: EM | Admit: 2013-04-20 | Discharge: 2013-04-20 | Disposition: A | Discharge status: Home / Self Care | Payer: Medicaid Other | Attending: Emergency Medicine | Admitting: Emergency Medicine

## 2013-04-20 DIAGNOSIS — Z8659 Personal history of other mental and behavioral disorders: Secondary | ICD-10-CM | POA: Insufficient documentation

## 2013-04-20 DIAGNOSIS — F17200 Nicotine dependence, unspecified, uncomplicated: Secondary | ICD-10-CM | POA: Insufficient documentation

## 2013-04-20 DIAGNOSIS — I10 Essential (primary) hypertension: Secondary | ICD-10-CM | POA: Insufficient documentation

## 2013-04-20 DIAGNOSIS — R05 Cough: Secondary | ICD-10-CM | POA: Insufficient documentation

## 2013-04-20 DIAGNOSIS — K859 Acute pancreatitis without necrosis or infection, unspecified: Secondary | ICD-10-CM

## 2013-04-20 DIAGNOSIS — M549 Dorsalgia, unspecified: Secondary | ICD-10-CM | POA: Insufficient documentation

## 2013-04-20 DIAGNOSIS — Z79899 Other long term (current) drug therapy: Secondary | ICD-10-CM | POA: Insufficient documentation

## 2013-04-20 DIAGNOSIS — R059 Cough, unspecified: Secondary | ICD-10-CM | POA: Insufficient documentation

## 2013-04-20 LAB — DIFFERENTIAL
Lymphocytes Relative: 19 % (ref 12–46)
Lymphs Abs: 2.8 10*3/uL (ref 0.7–4.0)
Monocytes Relative: 5 % (ref 3–12)
Neutro Abs: 11.1 10*3/uL — ABNORMAL HIGH (ref 1.7–7.7)
Neutrophils Relative %: 76 % (ref 43–77)

## 2013-04-20 LAB — COMPREHENSIVE METABOLIC PANEL
Albumin: 3.1 g/dL — ABNORMAL LOW (ref 3.5–5.2)
BUN: 8 mg/dL (ref 6–23)
Calcium: 9.4 mg/dL (ref 8.4–10.5)
Creatinine, Ser: 0.73 mg/dL (ref 0.50–1.35)
GFR calc Af Amer: >90 mL/min (ref >90)
Glucose, Bld: 115 mg/dL — ABNORMAL HIGH (ref 70–99)
Potassium: 3.6 mEq/L (ref 3.5–5.1)
Total Protein: 6.7 g/dL (ref 6.0–8.3)

## 2013-04-20 LAB — CBC
HCT: 39.5 % (ref 39.0–52.0)
Hemoglobin: 14.1 g/dL (ref 13.0–17.0)
MCH: 35.7 pg — ABNORMAL HIGH (ref 26.0–34.0)
MCHC: 35.7 g/dL (ref 30.0–36.0)
MCV: 100 fL (ref 78.0–100.0)
RDW: 14.3 % (ref 11.5–15.5)

## 2013-04-20 LAB — URINALYSIS, ROUTINE W REFLEX MICROSCOPIC
Glucose, UA: NEGATIVE mg/dL
Ketones, ur: NEGATIVE mg/dL
Leukocytes, UA: NEGATIVE
Nitrite: NEGATIVE
pH: 6 (ref 5.0–8.0)

## 2013-04-20 LAB — URINE MICROSCOPIC-ADD ON

## 2013-04-20 LAB — LIPASE, BLOOD: Lipase: 727 U/L — ABNORMAL HIGH (ref 11–59)

## 2013-04-20 MED ORDER — IOHEXOL 300 MG/ML  SOLN
100.0000 mL | Freq: Once | INTRAMUSCULAR | Status: AC | PRN
  Administered 2013-04-20: 100 mL via INTRAVENOUS

## 2013-04-20 MED ORDER — HYDROMORPHONE HCL PF 1 MG/ML IJ SOLN: 1.0000 mg | Freq: Once | INTRAMUSCULAR | Status: AC

## 2013-04-20 MED ORDER — SODIUM CHLORIDE 0.9 % IV BOLUS (SEPSIS)
1000.0000 mL | Freq: Once | INTRAVENOUS | Status: AC
  Administered 2013-04-20: 1000 mL via INTRAVENOUS

## 2013-04-20 MED ORDER — ONDANSETRON HCL 4 MG/2ML IJ SOLN: 4.0000 mg | Freq: Once | INTRAMUSCULAR | Status: AC

## 2013-04-20 MED ORDER — HYDROCODONE-ACETAMINOPHEN 5-325 MG PO TABS: 1.0000 | ORAL_TABLET | Freq: Four times a day (QID) | ORAL | Status: DC | PRN

## 2013-04-20 MED ORDER — IOHEXOL 300 MG/ML  SOLN
50.0000 mL | Freq: Once | INTRAMUSCULAR | Status: AC | PRN
  Administered 2013-04-20: 50 mL via ORAL

## 2013-04-20 MED ORDER — HYDROMORPHONE HCL PF 1 MG/ML IJ SOLN
INTRAMUSCULAR | Status: AC
  Administered 2013-04-20: 1 mg via INTRAVENOUS
  Filled 2013-04-20: qty 1

## 2013-04-20 MED ORDER — ONDANSETRON HCL 4 MG/2ML IJ SOLN
INTRAMUSCULAR | Status: AC
  Administered 2013-04-20: 4 mg via INTRAVENOUS
  Filled 2013-04-20: qty 2

## 2013-04-20 MED ORDER — PROMETHAZINE HCL 25 MG PO TABS: 25.0000 mg | ORAL_TABLET | Freq: Four times a day (QID) | ORAL | Status: DC | PRN

## 2013-04-20 MED ORDER — HYDROMORPHONE HCL PF 1 MG/ML IJ SOLN
1.0000 mg | Freq: Once | INTRAMUSCULAR | Status: AC
  Administered 2013-04-20: 1 mg via INTRAVENOUS
  Filled 2013-04-20: qty 1

## 2013-04-20 NOTE — ED Notes (Signed)
Pt still rates pain as a 9 on pain scale, states that the nausea is gone, pt drinking contrast for ct scan, is on second cup at present,

## 2013-04-20 NOTE — ED Provider Notes (Signed)
CSN: [id number]     Arrival date & time 04/20/13  0865 History  This chart was scribed for Shelda Jakes, MD, by Yevette Edwards, ED Scribe. This patient was seen in room APA14/APA14 and the patient's care was started at 1:34 PM.   First MD Initiated Contact with Patient 04/20/13 [phone]     Chief Complaint  Patient presents with  . Abdominal Pain    The history is provided by the patient. No language interpreter was used.   HPI Comments: Kirk Harper is a 52 y.o. male who presents to the Emergency Department complaining of constant epigastric abdominal pain which radiates to his back bilaterally. He reports the pain began yesterday evening, and he rates the pain as 9/10. The pt denies a h/o similar symptoms. The pt has a h/o splenectomy and possible pancreatitis. He uses alcohol, and he is a daily smoker.  Dr. Janna Arch is the pt's PCP.  Past Medical History  Diagnosis Date  . Bipolar affective   . Hypertension   . DDD (degenerative disc disease), lumbar   . Back pain   . Paranoia    Past Surgical History  Procedure Laterality Date  . Spleen  spleen removed  . Splenectomy     No family history on file. History  Substance Use Topics  . Smoking status: Current Every Day Smoker -- 0.50 packs/day    Types: Cigarettes  . Smokeless tobacco: Not on file  . Alcohol Use: Yes     Comment: occasionally    Review of Systems  HENT: Negative for congestion, sore throat and rhinorrhea.   Respiratory: Positive for cough (Productive of clear phlegm. ).   Cardiovascular: Negative for chest pain.  Gastrointestinal: Positive for abdominal pain. Negative for nausea, vomiting and diarrhea.  Genitourinary: Negative for dysuria and difficulty urinating.  Musculoskeletal: Positive for back pain.  Skin: Negative for rash.  Hematological: Does not bruise/bleed easily.    Allergies  Review of patient's allergies indicates no known allergies.  Home Medications   Current Outpatient Rx   Name  Route  Sig  Dispense  Refill  . lisinopril-hydrochlorothiazide (PRINZIDE,ZESTORETIC) 20-12.5 MG per tablet   Oral   Take 1 tablet by mouth daily.         Marland Kitchen LORazepam (ATIVAN) 1 MG tablet   Oral   Take 0.5 tablets (0.5 mg total) by mouth every 6 (six) hours as needed for anxiety (CIWA-AR > 8  -OR-  withdrawal symptoms:  anxiety, agitation, insomnia, diaphoresis, nausea, vomiting, tremors, tachycardia, or hypertension.).   28 tablet   0   . traZODone (DESYREL) 150 MG tablet   Oral   Take 75 mg by mouth at bedtime.          Marland Kitchen HYDROcodone-acetaminophen (NORCO/VICODIN) 5-325 MG per tablet   Oral   Take 1-2 tablets by mouth every 6 (six) hours as needed for pain.   20 tablet   0   . nicotine (NICODERM CQ - DOSED IN MG/24 HOURS) 21 mg/24hr patch   Transdermal   Place 14 patches onto the skin daily.   28 patch   1   . promethazine (PHENERGAN) 25 MG tablet   Oral   Take 1 tablet (25 mg total) by mouth every 6 (six) hours as needed for nausea.   12 tablet   0    BP 135/96  Pulse 81  Resp 17  SpO2 97% Physical Exam  Nursing note and vitals reviewed. Constitutional: He is oriented to person, place,  and time. He appears well-developed and well-nourished. No distress.  HENT:  Head: Normocephalic and atraumatic.  Eyes: EOM are normal.  Neck: Neck supple. No tracheal deviation present.  Cardiovascular: Normal rate, regular rhythm and normal heart sounds.   No murmur heard. Pulmonary/Chest: Effort normal and breath sounds normal. No respiratory distress. He has no wheezes. He has no rales.  Abdominal: Soft. Bowel sounds are normal. There is tenderness.  Tender to epigastrum.   Musculoskeletal: Normal range of motion. He exhibits no edema.  Neurological: He is alert and oriented to person, place, and time. No cranial nerve deficit.  Skin: Skin is warm and dry.  Psychiatric: He has a normal mood and affect. His behavior is normal.    ED Course  Procedures (including  critical care time)  DIAGNOSTIC STUDIES: Oxygen Saturation is 100% on room air, normal by my interpretation.    COORDINATION OF CARE:  9:39 AM- Discussed treatment plan with patient, and the patient agreed to the plan.    Date: 04/20/2013  Rate: 85  Rhythm: normal sinus rhythm and sinus arrhythmia  QRS Axis: normal  Intervals: normal  ST/T Wave abnormalities: normal  Conduction Disutrbances:none  Narrative Interpretation:   Old EKG Reviewed: none available  Labs Review Labs Reviewed  CBC - Abnormal; Notable for the following:    WBC 14.6 (*)    RBC 3.95 (*)    MCH 35.7 (*)    Platelets 128 (*)    All other components within normal limits  COMPREHENSIVE METABOLIC PANEL - Abnormal; Notable for the following:    Sodium 130 (*)    Chloride 91 (*)    Glucose, Bld 115 (*)    Albumin 3.1 (*)    AST 130 (*)    ALT 84 (*)    Alkaline Phosphatase 191 (*)    Total Bilirubin 1.5 (*)    All other components within normal limits  DIFFERENTIAL - Abnormal; Notable for the following:    Neutro Abs 11.1 (*)    All other components within normal limits  LIPASE, BLOOD - Abnormal; Notable for the following:    Lipase 727 (*)    All other components within normal limits  URINALYSIS, ROUTINE W REFLEX MICROSCOPIC - Abnormal; Notable for the following:    Protein, ur TRACE (*)    All other components within normal limits  URINE MICROSCOPIC-ADD ON   Results for orders placed during the hospital encounter of 04/20/13  CBC      Result Value Range   WBC 14.6 (*) 4.0 - 10.5 K/uL   RBC 3.95 (*) 4.22 - 5.81 MIL/uL   Hemoglobin 14.1  13.0 - 17.0 g/dL   HCT 64.4  03.4 - 74.2 %   MCV 100.0  78.0 - 100.0 fL   MCH 35.7 (*) 26.0 - 34.0 pg   MCHC 35.7  30.0 - 36.0 g/dL   RDW 59.5  63.8 - 75.6 %   Platelets 128 (*) 150 - 400 K/uL  COMPREHENSIVE METABOLIC PANEL      Result Value Range   Sodium 130 (*) 135 - 145 mEq/L   Potassium 3.6  3.5 - 5.1 mEq/L   Chloride 91 (*) 96 - 112 mEq/L   CO2 26   19 - 32 mEq/L   Glucose, Bld 115 (*) 70 - 99 mg/dL   BUN 8  6 - 23 mg/dL   Creatinine, Ser 4.33  0.50 - 1.35 mg/dL   Calcium 9.4  8.4 - 29.5 mg/dL   Total  Protein 6.7  6.0 - 8.3 g/dL   Albumin 3.1 (*) 3.5 - 5.2 g/dL   AST 161 (*) 0 - 37 U/L   ALT 84 (*) 0 - 53 U/L   Alkaline Phosphatase 191 (*) 39 - 117 U/L   Total Bilirubin 1.5 (*) 0.3 - 1.2 mg/dL   GFR calc non Af Amer >90  >90 mL/min   GFR calc Af Amer >90  >90 mL/min  DIFFERENTIAL      Result Value Range   Neutrophils Relative % 76  43 - 77 %   Neutro Abs 11.1 (*) 1.7 - 7.7 K/uL   Lymphocytes Relative 19  12 - 46 %   Lymphs Abs 2.8  0.7 - 4.0 K/uL   Monocytes Relative 5  3 - 12 %   Monocytes Absolute 0.7  0.1 - 1.0 K/uL   Eosinophils Relative 0  0 - 5 %   Eosinophils Absolute 0.1  0.0 - 0.7 K/uL   Basophils Relative 0  0 - 1 %   Basophils Absolute 0.0  0.0 - 0.1 K/uL  LIPASE, BLOOD      Result Value Range   Lipase 727 (*) 11 - 59 U/L  URINALYSIS, ROUTINE W REFLEX MICROSCOPIC      Result Value Range   Color, Urine YELLOW  YELLOW   APPearance CLEAR  CLEAR   Specific Gravity, Urine 1.020  1.005 - 1.030   pH 6.0  5.0 - 8.0   Glucose, UA NEGATIVE  NEGATIVE mg/dL   Hgb urine dipstick NEGATIVE  NEGATIVE   Bilirubin Urine NEGATIVE  NEGATIVE   Ketones, ur NEGATIVE  NEGATIVE mg/dL   Protein, ur TRACE (*) NEGATIVE mg/dL   Urobilinogen, UA 0.2  0.0 - 1.0 mg/dL   Nitrite NEGATIVE  NEGATIVE   Leukocytes, UA NEGATIVE  NEGATIVE  URINE MICROSCOPIC-ADD ON      Result Value Range   WBC, UA 0-2  <3 WBC/hpf    Imaging Review Ct Abdomen Pelvis W Contrast  04/20/2013   CLINICAL DATA:  Epigastric pain and history of prior pancreatitis  EXAM: CT ABDOMEN AND PELVIS WITH CONTRAST  TECHNIQUE: Multidetector CT imaging of the abdomen and pelvis was performed using the standard protocol following bolus administration of intravenous contrast.  CONTRAST:  50mL OMNIPAQUE IOHEXOL 300 MG/ML SOLN, OMNIPAQUE IOHEXOL 300 MG/ML SOLN  COMPARISON:   02/12/2013  FINDINGS: The lung bases are free of acute infiltrate or sizable effusion.  Diffuse fatty infiltration of the liver is noted. Gallstones are again seen within the gallbladder without complicating factors. The spleen is been surgically removed. . The adrenal glands and kidneys are within normal limits. No obstructive changes are seen.  The pancreas is again well visualized and demonstrates diffuse edematous changes within the head of the pancreas with surrounding inflammatory changes adjacent to the 2nd portion of the duodenum consistent with acute pancreatitis. The previously seen changes in the tail of the pancreas have resolved in the interval.  The appendix is within normal limits. Scanning into the pelvis demonstrates the bladder to be well distended. No pelvic mass lesion or sidewall abnormality is noted. Degenerative changes of the lumbar spine are seen.  IMPRESSION: Stable cholelithiasis.  New findings of acute pancreatitis involving the head of the pancreas with adjacent duodenal inflammatory change.  Fatty liver.   Electronically Signed   By: Alcide Clever   On: 04/20/2013 12:02    MDM   1. Pancreatitis, acute    Patient's lab findings and CT  findings consistent with acute pancreatitis most likely related to alcohol. Patient does have a history of gallstones with there does not seem to be any evidence of acute cholecystitis or enlargement of the common bile duct. But that also can be an etiology of the pancreatitis. Despite the elevated lipase renal function is normal. Patient fell once to go home I he feels significantly better pain is resolved. Had recommended patient that he was concerned about the pain or trouble keeping clear liquids down that we could admit that he does not want that he wants to go home. Patient was warned to stay away from alcohol patient will followup with his primary care Dr.  I personally performed the services described in this documentation, which was scribed  in my presence. The recorded information has been reviewed and is accurate.     Shelda Jakes, MD 04/20/13 [phone]

## 2013-04-20 NOTE — ED Notes (Signed)
Dr Zackowski at bedside,  

## 2013-06-23 ENCOUNTER — Encounter (HOSPITAL_COMMUNITY): Payer: Self-pay | Admitting: Pharmacist

## 2013-06-24 NOTE — H&P (Signed)
HISTORY AND PHYSICAL  Kirk Harper is a 52 y.o. male patient with CC: Toothache.  No diagnosis found.  Past Medical History  Diagnosis Date  . Bipolar affective   . Hypertension   . DDD (degenerative disc disease), lumbar   . Back pain   . Paranoia     No current facility-administered medications for this encounter.   Current Outpatient Prescriptions  Medication Sig Dispense Refill  . chlorproMAZINE (THORAZINE) 50 MG tablet Take 50 mg by mouth at bedtime.      Marland Kitchen oxyCODONE-acetaminophen (PERCOCET/ROXICET) 5-325 MG per tablet Take 1 tablet by mouth every 8 (eight) hours as needed (pain).      . traZODone (DESYREL) 150 MG tablet Take 25 mg by mouth at bedtime as needed for sleep.        No Known Allergies Active Problems:   * No active hospital problems. *  Vitals: There were no vitals taken for this visit. Lab results:No results found for this or any previous visit (from the past 24 hour(s)). Radiology Results: No results found. General appearance: alert and cooperative Head: Normocephalic, without obvious abnormality, atraumatic Eyes: negative Ears: normal TM's and external ear canals both ears Throat: severe  dental caries, severe generalized periodontitis Neck: no adenopathy, supple, symmetrical, trachea midline and thyroid not enlarged, symmetric, no tenderness/mass/nodules Resp: clear to auscultation bilaterally Cardio: regular rate and rhythm, S1, S2 normal, no murmur, click, rub or gallop  Assessment: nonrestorable teeth. Bilateral mandibular tori  Plan: full mouth extractions, alveoloplasty, removal mandiblular tori.   Kirk Harper 06/24/2013

## 2013-06-25 ENCOUNTER — Encounter (HOSPITAL_COMMUNITY): Payer: Self-pay | Admitting: *Deleted

## 2013-06-28 ENCOUNTER — Encounter (HOSPITAL_COMMUNITY)
Admission: RE | Disposition: A | Discharge status: Home / Self Care | Payer: Self-pay | Source: Ambulatory Visit | Attending: Oral Surgery

## 2013-06-28 ENCOUNTER — Ambulatory Visit (HOSPITAL_COMMUNITY): Payer: Medicaid Other | Admitting: Anesthesiology

## 2013-06-28 ENCOUNTER — Ambulatory Visit (HOSPITAL_COMMUNITY)
Admission: RE | Admit: 2013-06-28 | Discharge: 2013-06-28 | Disposition: A | Discharge status: Home / Self Care | Payer: Medicaid Other | Source: Ambulatory Visit | Attending: Oral Surgery | Admitting: Oral Surgery

## 2013-06-28 ENCOUNTER — Encounter (HOSPITAL_COMMUNITY): Payer: Self-pay | Admitting: *Deleted

## 2013-06-28 ENCOUNTER — Encounter (HOSPITAL_COMMUNITY): Payer: Medicaid Other | Admitting: Anesthesiology

## 2013-06-28 ENCOUNTER — Ambulatory Visit (HOSPITAL_COMMUNITY): Payer: Medicaid Other

## 2013-06-28 DIAGNOSIS — K709 Alcoholic liver disease, unspecified: Secondary | ICD-10-CM

## 2013-06-28 DIAGNOSIS — K029 Dental caries, unspecified: Secondary | ICD-10-CM

## 2013-06-28 DIAGNOSIS — B8809 Other acariasis: Secondary | ICD-10-CM

## 2013-06-28 DIAGNOSIS — F1010 Alcohol abuse, uncomplicated: Secondary | ICD-10-CM

## 2013-06-28 DIAGNOSIS — E860 Dehydration: Secondary | ICD-10-CM

## 2013-06-28 DIAGNOSIS — F17200 Nicotine dependence, unspecified, uncomplicated: Secondary | ICD-10-CM | POA: Insufficient documentation

## 2013-06-28 DIAGNOSIS — K859 Acute pancreatitis without necrosis or infection, unspecified: Secondary | ICD-10-CM

## 2013-06-28 DIAGNOSIS — IMO0002 Reserved for concepts with insufficient information to code with codable children: Secondary | ICD-10-CM | POA: Insufficient documentation

## 2013-06-28 DIAGNOSIS — F319 Bipolar disorder, unspecified: Secondary | ICD-10-CM | POA: Insufficient documentation

## 2013-06-28 DIAGNOSIS — F259 Schizoaffective disorder, unspecified: Secondary | ICD-10-CM

## 2013-06-28 DIAGNOSIS — K0530 Chronic periodontitis, unspecified: Secondary | ICD-10-CM

## 2013-06-28 DIAGNOSIS — M519 Unspecified thoracic, thoracolumbar and lumbosacral intervertebral disc disorder: Secondary | ICD-10-CM

## 2013-06-28 DIAGNOSIS — M278 Other specified diseases of jaws: Secondary | ICD-10-CM | POA: Insufficient documentation

## 2013-06-28 DIAGNOSIS — I10 Essential (primary) hypertension: Secondary | ICD-10-CM | POA: Insufficient documentation

## 2013-06-28 DIAGNOSIS — K861 Other chronic pancreatitis: Secondary | ICD-10-CM

## 2013-06-28 DIAGNOSIS — B880 Other acariasis: Secondary | ICD-10-CM

## 2013-06-28 HISTORY — DX: Alcohol abuse, uncomplicated: F10.10

## 2013-06-28 HISTORY — DX: Gastro-esophageal reflux disease without esophagitis: K21.9

## 2013-06-28 HISTORY — PX: MULTIPLE EXTRACTIONS WITH ALVEOLOPLASTY: SHX5342

## 2013-06-28 HISTORY — DX: Acute pancreatitis without necrosis or infection, unspecified: K85.90

## 2013-06-28 LAB — COMPREHENSIVE METABOLIC PANEL
ALT: 29 U/L (ref 0–53)
Calcium: 9.5 mg/dL (ref 8.4–10.5)
Chloride: 100 mEq/L (ref 96–112)
GFR calc Af Amer: >90 mL/min (ref >90)
Glucose, Bld: 105 mg/dL — ABNORMAL HIGH (ref 70–99)
Sodium: 134 mEq/L — ABNORMAL LOW (ref 135–145)
Total Protein: 7.3 g/dL (ref 6.0–8.3)

## 2013-06-28 LAB — CBC
HCT: 44 % (ref 39.0–52.0)
Hemoglobin: 15.3 g/dL (ref 13.0–17.0)
MCH: 36.5 pg — ABNORMAL HIGH (ref 26.0–34.0)
MCHC: 34.8 g/dL (ref 30.0–36.0)
MCV: 105 fL — ABNORMAL HIGH (ref 78.0–100.0)
RBC: 4.19 MIL/uL — ABNORMAL LOW (ref 4.22–5.81)
RDW: 13.4 % (ref 11.5–15.5)
WBC: 11.1 10*3/uL — ABNORMAL HIGH (ref 4.0–10.5)

## 2013-06-28 SURGERY — MULTIPLE EXTRACTION WITH ALVEOLOPLASTY
Anesthesia: General | Site: Mouth

## 2013-06-28 MED ORDER — OXYCODONE HCL 5 MG PO TABS: 5.0000 mg | ORAL_TABLET | Freq: Once | ORAL | Status: DC | PRN

## 2013-06-28 MED ORDER — NICOTINE 21 MG/24HR TD PT24: 21.0000 mg | MEDICATED_PATCH | Freq: Every day | TRANSDERMAL | Status: DC

## 2013-06-28 MED ORDER — ROCURONIUM BROMIDE 100 MG/10ML IV SOLN
INTRAVENOUS | Status: DC | PRN
  Administered 2013-06-28: 50 mg via INTRAVENOUS

## 2013-06-28 MED ORDER — OXYCODONE HCL 5 MG/5ML PO SOLN: 5.0000 mg | Freq: Once | ORAL | Status: DC | PRN

## 2013-06-28 MED ORDER — NEOSTIGMINE METHYLSULFATE 1 MG/ML IJ SOLN
INTRAMUSCULAR | Status: DC | PRN
  Administered 2013-06-28: 5 mg via INTRAVENOUS

## 2013-06-28 MED ORDER — GLYCOPYRROLATE 0.2 MG/ML IJ SOLN
INTRAMUSCULAR | Status: DC | PRN
  Administered 2013-06-28: 0.6 mg via INTRAVENOUS

## 2013-06-28 MED ORDER — OXYCODONE-ACETAMINOPHEN 10-325 MG PO TABS: 1.0000 | ORAL_TABLET | ORAL | Status: DC | PRN

## 2013-06-28 MED ORDER — OXYMETAZOLINE HCL 0.05 % NA SOLN
NASAL | Status: AC
  Filled 2013-06-28: qty 15

## 2013-06-28 MED ORDER — LIDOCAINE-EPINEPHRINE 2 %-1:100000 IJ SOLN
INTRAMUSCULAR | Status: DC | PRN
  Administered 2013-06-28: 14 mL

## 2013-06-28 MED ORDER — CEFAZOLIN SODIUM-DEXTROSE 2-3 GM-% IV SOLR
2.0000 g | Freq: Once | INTRAVENOUS | Status: DC
  Filled 2013-06-28: qty 50

## 2013-06-28 MED ORDER — MIDAZOLAM HCL 5 MG/5ML IJ SOLN
INTRAMUSCULAR | Status: DC | PRN
  Administered 2013-06-28: 2 mg via INTRAVENOUS

## 2013-06-28 MED ORDER — HYDROMORPHONE HCL PF 1 MG/ML IJ SOLN
INTRAMUSCULAR | Status: AC
  Administered 2013-06-28: 0.5 mg via INTRAVENOUS
  Filled 2013-06-28: qty 1

## 2013-06-28 MED ORDER — ONDANSETRON HCL 4 MG/2ML IJ SOLN: 4.0000 mg | Freq: Once | INTRAMUSCULAR | Status: DC | PRN

## 2013-06-28 MED ORDER — LACTATED RINGERS IV SOLN
INTRAVENOUS | Status: DC | PRN
  Administered 2013-06-28 (×2): via INTRAVENOUS

## 2013-06-28 MED ORDER — LACTATED RINGERS IV SOLN
INTRAVENOUS | Status: DC
  Administered 2013-06-28: 08:00:00 via INTRAVENOUS

## 2013-06-28 MED ORDER — ARTIFICIAL TEARS OP OINT
TOPICAL_OINTMENT | OPHTHALMIC | Status: DC | PRN
  Administered 2013-06-28: 1 via OPHTHALMIC

## 2013-06-28 MED ORDER — FENTANYL CITRATE 0.05 MG/ML IJ SOLN
INTRAMUSCULAR | Status: DC | PRN
  Administered 2013-06-28: 100 ug via INTRAVENOUS
  Administered 2013-06-28: 150 ug via INTRAVENOUS
  Administered 2013-06-28: 100 ug via INTRAVENOUS

## 2013-06-28 MED ORDER — PROPOFOL 10 MG/ML IV BOLUS
INTRAVENOUS | Status: DC | PRN
  Administered 2013-06-28: 190 mg via INTRAVENOUS

## 2013-06-28 MED ORDER — LIDOCAINE-EPINEPHRINE 2 %-1:100000 IJ SOLN
INTRAMUSCULAR | Status: AC
  Filled 2013-06-28: qty 1

## 2013-06-28 MED ORDER — SODIUM CHLORIDE 0.9 % IR SOLN
Status: DC | PRN
  Administered 2013-06-28: 1000 mL

## 2013-06-28 MED ORDER — LIDOCAINE HCL (CARDIAC) 20 MG/ML IV SOLN
INTRAVENOUS | Status: DC | PRN
  Administered 2013-06-28: 90 mg via INTRAVENOUS

## 2013-06-28 MED ORDER — 0.9 % SODIUM CHLORIDE (POUR BTL) OPTIME
TOPICAL | Status: DC | PRN
  Administered 2013-06-28: 1000 mL

## 2013-06-28 MED ORDER — HYDROMORPHONE HCL PF 1 MG/ML IJ SOLN
0.2500 mg | INTRAMUSCULAR | Status: DC | PRN
  Administered 2013-06-28 (×2): 0.5 mg via INTRAVENOUS

## 2013-06-28 MED ORDER — CEFAZOLIN SODIUM-DEXTROSE 2-3 GM-% IV SOLR
INTRAVENOUS | Status: AC
  Administered 2013-06-28: 2 g via INTRAVENOUS
  Filled 2013-06-28: qty 50

## 2013-06-28 MED ORDER — ONDANSETRON HCL 4 MG/2ML IJ SOLN
INTRAMUSCULAR | Status: DC | PRN
  Administered 2013-06-28: 4 mg via INTRAVENOUS

## 2013-06-28 SURGICAL SUPPLY — 27 items
BUR CROSS CUT FISSURE 1.6 (BURR) ×2 IMPLANT
BUR EGG ELITE 4.0 (BURR) ×1 IMPLANT
CANISTER SUCTION 2500CC (MISCELLANEOUS) ×2 IMPLANT
CLOTH BEACON ORANGE TIMEOUT ST (SAFETY) ×1 IMPLANT
COVER SURGICAL LIGHT HANDLE (MISCELLANEOUS) ×2 IMPLANT
CRADLE DONUT ADULT HEAD (MISCELLANEOUS) ×2 IMPLANT
DECANTER SPIKE VIAL GLASS SM (MISCELLANEOUS) ×1 IMPLANT
GAUZE PACKING FOLDED 2  STR (GAUZE/BANDAGES/DRESSINGS) ×1
GAUZE PACKING FOLDED 2 STR (GAUZE/BANDAGES/DRESSINGS) ×1 IMPLANT
GLOVE BIO SURGEON STRL SZ 6.5 (GLOVE) ×2 IMPLANT
GLOVE BIO SURGEON STRL SZ7.5 (GLOVE) ×2 IMPLANT
GLOVE BIOGEL PI IND STRL 7.0 (GLOVE) ×1 IMPLANT
GLOVE BIOGEL PI INDICATOR 7.0 (GLOVE) ×1
GOWN STRL NON-REIN LRG LVL3 (GOWN DISPOSABLE) ×2 IMPLANT
GOWN STRL REIN XL XLG (GOWN DISPOSABLE) ×2 IMPLANT
KIT BASIN OR (CUSTOM PROCEDURE TRAY) ×2 IMPLANT
KIT ROOM TURNOVER OR (KITS) ×2 IMPLANT
NEEDLE 22X1 1/2 (OR ONLY) (NEEDLE) ×2 IMPLANT
NS IRRIG 1000ML POUR BTL (IV SOLUTION) ×2 IMPLANT
PAD ARMBOARD 7.5X6 YLW CONV (MISCELLANEOUS) ×4 IMPLANT
SUT CHROMIC 3 0 PS 2 (SUTURE) ×3 IMPLANT
SYR CONTROL 10ML LL (SYRINGE) ×2 IMPLANT
TOWEL OR 17X26 10 PK STRL BLUE (TOWEL DISPOSABLE) ×2 IMPLANT
TRAY ENT MC OR (CUSTOM PROCEDURE TRAY) ×2 IMPLANT
TUBING IRRIGATION (MISCELLANEOUS) ×1 IMPLANT
WATER STERILE IRR 1000ML POUR (IV SOLUTION) IMPLANT
YANKAUER SUCT BULB TIP NO VENT (SUCTIONS) ×2 IMPLANT

## 2013-06-28 NOTE — H&P (Signed)
H&P documentation  -History and Physical Reviewed  -Patient has been re-examined  -No change in the plan of care  Kirk Harper M  

## 2013-06-28 NOTE — Anesthesia Preprocedure Evaluation (Signed)
Anesthesia Evaluation  Patient identified by MRN, date of birth, ID band Patient awake    Reviewed: Allergy & Precautions, H&P , NPO status , Patient's Chart, lab work & pertinent test results  Airway Mallampati: II TM Distance: >3 FB Neck ROM: Full    Dental  (+) Loose, Teeth Intact and Poor Dentition,    Pulmonary Current Smoker,  breath sounds clear to auscultation        Cardiovascular hypertension, Rhythm:Regular Rate:Normal     Neuro/Psych    GI/Hepatic   Endo/Other    Renal/GU      Musculoskeletal   Abdominal   Peds  Hematology   Anesthesia Other Findings   Reproductive/Obstetrics                           Anesthesia Physical Anesthesia Plan  ASA: III  Anesthesia Plan: General   Post-op Pain Management:    Induction: Intravenous  Airway Management Planned: Nasal ETT  Additional Equipment:   Intra-op Plan:   Post-operative Plan: Extubation in OR  Informed Consent: I have reviewed the patients History and Physical, chart, labs and discussed the procedure including the risks, benefits and alternatives for the proposed anesthesia with the patient or authorized representative who has indicated his/her understanding and acceptance.   Dental advisory given  Plan Discussed with:   Anesthesia Plan Comments: (Schizophrenia Smoker Poor dentition  Plan GA with naso-tracheal ETT  Kipp Brood, MD)        Anesthesia Quick Evaluation

## 2013-06-28 NOTE — Anesthesia Postprocedure Evaluation (Signed)
  Anesthesia Post-op Note  Patient: Kirk Harper  Procedure(s) Performed: Procedure(s): MULTIPLE EXTRACTION WITH ALVEOLOPLASTY AND TORI  (N/A)  Patient Location: PACU  Anesthesia Type:General  Level of Consciousness: awake, alert  and oriented  Airway and Oxygen Therapy: Patient Spontanous Breathing  Post-op Pain: mild  Post-op Assessment: Post-op Vital signs reviewed, Patient's Cardiovascular Status Stable, Respiratory Function Stable, Patent Airway and Pain level controlled  Post-op Vital Signs: stable  Complications: No apparent anesthesia complications

## 2013-06-28 NOTE — Transfer of Care (Signed)
Immediate Anesthesia Transfer of Care Note  Patient: Kirk Harper  Procedure(s) Performed: Procedure(s): MULTIPLE EXTRACTION WITH ALVEOLOPLASTY AND TORI  (N/A)  Patient Location: PACU  Anesthesia Type:General  Level of Consciousness: awake, alert  and oriented  Airway & Oxygen Therapy: Patient Spontanous Breathing and Patient connected to face mask oxygen  Post-op Assessment: Report given to PACU RN, Post -op Vital signs reviewed and stable and Patient moving all extremities X 4  Post vital signs: Reviewed and stable  Complications: No apparent anesthesia complications

## 2013-06-28 NOTE — Op Note (Signed)
06/28/2013  9:33 AM  PATIENT:  Kirk Harper  52 y.o. male  PRE-OPERATIVE DIAGNOSIS:  NONRESTORABLE TEETH # 3,  5, 6,  8, 9, 11, 12, 14, 17, 18, 20, 21, 22, 23, 24, 26, 27, 28, 29, 32, bilateral mandibular lingual tori  POST-OPERATIVE DIAGNOSIS:  SAME  PROCEDURE:  Procedure(s): MULTIPLE EXTRACTION TEETH # 3,  5, 6,  8, 9, 11, 12, 14, 17, 18, 20, 21, 22, 23, 24, 26, 27, 28, 29, 32, WITH ALVEOLOPLASTY AND REMOVAL BILATERAL MANDIBULAR TORI   SURGEON:  Surgeon(s): Georgia Lopes, DDS  ANESTHESIA:   local and general  EBL:  minimal  DRAINS: none   SPECIMEN:  No Specimen  COUNTS:  YES  PLAN OF CARE: Discharge to home after PACU  PATIENT DISPOSITION:  PACU - hemodynamically stable.   PROCEDURE DETAILS: Dictation #[id number]  Georgia Lopes, DMD 06/28/2013 9:33 AM

## 2013-06-29 ENCOUNTER — Encounter (HOSPITAL_COMMUNITY): Payer: Self-pay | Admitting: Oral Surgery

## 2013-06-29 NOTE — Op Note (Signed)
NAME:  Kirk Harper, Kirk Harper NO.:  [id number]  MEDICAL RECORD NO.:  [id number]  LOCATION:  [facility]                         FACILITY:  [facility]  PHYSICIAN:  Georgia Lopes, M.D.  DATE OF BIRTH:  1961-01-11  DATE OF PROCEDURE:  06/28/2013 DATE OF DISCHARGE:  06/28/2013                              OPERATIVE REPORT   PREOPERATIVE DIAGNOSES:  Nonrestorable teeth #3, 5, 6, 8, 9, 11, 12, 14, 17, 18, 20, 21, 22, 23, 24, 26, 27, 28, 29, and 32.  Bilateral mandibular lingual tori.  POSTOPERATIVE DIAGNOSES:  Nonrestorable teeth #3, 5, 6, 8, 9, 11, 12, 14, 17, 18, 20, 21, 22, 23, 24, 26, 27, 28, 29, and 32. Bilateral mandibular lingual tori.  PROCEDURE:  Extraction of teeth #3, 5, 6, 8, 9, 11, 12, 14, 17, 18, 20, 21, 22, 23, 24, 26, 27, 28, 29, 32.  Alveoplasty right and left maxilla and mandible, removal of bilateral mandibular lingual tori.  SURGEON:  Georgia Lopes, MD  ANESTHESIA:  General, Dr. Noreene Larsson attending, nasal intubation.  PROCEDURE:  The patient was taken to the operating room, placed on the table in supine position.  General anesthesia was administered intravenously and a nasal endotracheal tube was placed and secured.  The eyes were protected.  The patient was draped for the procedure.  Time- out was then performed, and then the posterior pharynx was suctioned.  A throat pack was placed.  A 2% lidocaine with 1:100,000 epinephrine was infiltrated in an inferior alveolar block on the right and left side and a buccal infiltration and palatal infiltration in the maxilla total of 14 mL of anesthetic solution was utilized.  A bite block was placed on the right side of the mouth and a sweetheart retractor was used to retract the tongue.  A 15 blade was used to make an incision beginning in the left posterior mandible at tooth #18 carrying anteriorly buckling and lingually around teeth # 20, 21, 22, 23, 24, 26 in the mandible. Then in the maxilla an incision was made  around teeth #14, 12, 11, 9 and 8 on the buccal and palatal surfaces.  The periosteum was reflected. Then, the teeth were elevated and removed with the #23 and Asch forceps in the mandible and the #150 forceps in the maxilla.  The sockets were then curetted.  Tooth #12 fractured during removal and required removal of additional bone with the fissure burr and irrigation. The tooth was then removed using the 301 elevator and rongeurs.  The periosteum was further reflected to expose the alveolar crest in the maxilla and mandible, and in the mandible the lingual reflection was carried to expose the mandibular tuberosity which was approximately 1.5 cm x 1.0 cm.  Then, the alveoplasty was performed with an egg-shaped burr in the maxilla and mandible, and the torus was removed in the left lingual mandible using the Seldin retractor to retract the lingual mucosa.  The bone file was then used to further smooth the bone and the areas were then irrigated and closed with 3-0 chromic.  The bite block was repositioned to the other side of the mouth as was the sweetheart retractor, and the 15  blade was used to make an incision at the area of tooth #32, carrying anteriorly to tooth #27, 28, 29 on the buccal and lingual surfaces and in the maxilla.  The 15 blade was used to make an incision around teeth #3, 5, 6, 8, and then carried anteriorly to connect with the previous incision.  The periosteum was then reflected in the maxilla and mandible, and the teeth were elevated with a 301 elevator.  The upper teeth were removed with the #150 forceps.  The lower teeth were removed with the #23 forceps in the posterior and the Asch forceps in the more anterior region.  The sockets were then curetted.  The periosteum was further reflected in the maxilla and mandible to expose the bone and the torus.  Then the alveoplasty was performed using the egg-shaped burr and the torus was removed using the egg-shaped burr  with the Seldin retractor used to protect the lingual tissue.  The maxilla and mandible on the right side were then further smoothed with the bone file.  Then, the areas were irrigated and closed with 3-0 chromic.  The oral cavity was then inspected and found to have good contour hemostasis and closure.  The oral cavity was irrigated and suctioned.  Throat pack was removed.  The patient was awakened and taken to the recovery room, breathing spontaneously in good condition.  EBL:  Minimum.  COMPLICATIONS:  None.  SPECIMENS:  None.  Tooth #12 fractured during removal and required removal of additional bone with the fissure burr and irrigation.  The tooth was then removed using the 301 elevator and rongeurs.     Georgia Lopes, M.D.     SMJ/MEDQ  D:  06/28/2013  T:  06/29/2013  Job:  [id number]

## 2014-02-01 ENCOUNTER — Encounter (HOSPITAL_COMMUNITY): Payer: Self-pay | Admitting: Emergency Medicine

## 2014-02-01 ENCOUNTER — Emergency Department (HOSPITAL_COMMUNITY)
Admission: EM | Admit: 2014-02-01 | Discharge: 2014-02-01 | Disposition: A | Discharge status: Home / Self Care | Payer: Medicaid Other | Attending: Emergency Medicine | Admitting: Emergency Medicine

## 2014-02-01 DIAGNOSIS — M5137 Other intervertebral disc degeneration, lumbosacral region: Secondary | ICD-10-CM | POA: Insufficient documentation

## 2014-02-01 DIAGNOSIS — M79609 Pain in unspecified limb: Secondary | ICD-10-CM | POA: Insufficient documentation

## 2014-02-01 DIAGNOSIS — Z79899 Other long term (current) drug therapy: Secondary | ICD-10-CM | POA: Diagnosis not present

## 2014-02-01 DIAGNOSIS — F17200 Nicotine dependence, unspecified, uncomplicated: Secondary | ICD-10-CM | POA: Diagnosis not present

## 2014-02-01 DIAGNOSIS — M79601 Pain in right arm: Secondary | ICD-10-CM

## 2014-02-01 DIAGNOSIS — Z8719 Personal history of other diseases of the digestive system: Secondary | ICD-10-CM | POA: Insufficient documentation

## 2014-02-01 DIAGNOSIS — M542 Cervicalgia: Secondary | ICD-10-CM | POA: Insufficient documentation

## 2014-02-01 DIAGNOSIS — M51379 Other intervertebral disc degeneration, lumbosacral region without mention of lumbar back pain or lower extremity pain: Secondary | ICD-10-CM | POA: Insufficient documentation

## 2014-02-01 DIAGNOSIS — I10 Essential (primary) hypertension: Secondary | ICD-10-CM | POA: Diagnosis not present

## 2014-02-01 DIAGNOSIS — Z8659 Personal history of other mental and behavioral disorders: Secondary | ICD-10-CM | POA: Diagnosis not present

## 2014-02-01 MED ORDER — NAPROXEN 500 MG PO TABS: 500.0000 mg | ORAL_TABLET | Freq: Two times a day (BID) | ORAL | Status: DC

## 2014-02-01 MED ORDER — IBUPROFEN 800 MG PO TABS
800.0000 mg | ORAL_TABLET | Freq: Once | ORAL | Status: AC
  Administered 2014-02-01: 800 mg via ORAL
  Filled 2014-02-01: qty 1

## 2014-02-01 MED ORDER — OXYCODONE-ACETAMINOPHEN 5-325 MG PO TABS: 1.0000 | ORAL_TABLET | ORAL | Status: DC | PRN

## 2014-02-01 MED ORDER — OXYCODONE-ACETAMINOPHEN 5-325 MG PO TABS
1.0000 | ORAL_TABLET | Freq: Once | ORAL | Status: AC
  Administered 2014-02-01: 1 via ORAL
  Filled 2014-02-01: qty 1

## 2014-02-01 MED ORDER — ORPHENADRINE CITRATE ER 100 MG PO TB12: 100.0000 mg | ORAL_TABLET | Freq: Two times a day (BID) | ORAL | Status: DC

## 2014-02-01 NOTE — ED Notes (Signed)
Discharge instructions given and reviewed with patient.  Prescriptions given for Percocet, Naprosyn and Norflex; effects and use explained.  Patient verbalized understanding of sedating effects of medications and to follow up with PMD in 1 week.  Patient ambulatory; discharged home in good condition.

## 2014-02-01 NOTE — ED Provider Notes (Addendum)
CSN: [id number]     Arrival date & time 02/01/14  2213 History  This chart was scribed for Kirk Boozeavid Isley Weisheit, MD by Modena JanskyAlbert Thayil, ED Scribe. This patient was seen in room APA18/AP[facility]18 and the patient's care was started at 11:08 PM.  Chief Complaint  Patient presents with  . Arm Pain   HPI HPI Comments: Kirk Harper is a 53 y.o. male who presents to the Emergency Department complaining of right arm pain that started 3 days He reports that the pain radiates from his elbow to his neck. He states that is unsure of the cause of his pain. He denies any falls. He describes the pain as throbbing. He reports that he took excedrin with no relief. He denies any trouble grasping objects. He reports associated numbness in his right upper arm and toes. He states that he has had a bad experience with muscle relaxers.   PCP- Dr. Janna Archondiego Past Medical History  Diagnosis Date  . Bipolar affective   . Hypertension   . DDD (degenerative disc disease), lumbar   . Back pain   . Paranoia   . Pancreatitis   . GERD (gastroesophageal reflux disease)     takes tums prn  . Alcohol abuse    Past Surgical History  Procedure Laterality Date  . Spleen  spleen removed  . Splenectomy    . Multiple extractions with alveoloplasty N/A 06/28/2013    Procedure: MULTIPLE EXTRACTION WITH ALVEOLOPLASTY AND TORI ;  Surgeon: Georgia LopesScott M Jensen, DDS;  Location: [facility] OR;  Service: Oral Surgery;  Laterality: N/A;   History reviewed. No pertinent family history. History  Substance Use Topics  . Smoking status: Current Every Day Smoker -- 0.50 packs/day for 40 years    Types: Cigarettes  . Smokeless tobacco: Not on file  . Alcohol Use: 24.0 oz/week    48 drink(s) per week     Comment: occasionally    Review of Systems  Musculoskeletal: Positive for myalgias and neck pain.  Neurological: Positive for numbness.  All other systems reviewed and are negative.     Allergies  Review of patient's allergies indicates no known  allergies.  Home Medications   Prior to Admission medications   Medication Sig Start Date End Date Taking? Authorizing Provider  chlorproMAZINE (THORAZINE) 50 MG tablet Take 50 mg by mouth at bedtime.    Historical Provider, MD  nicotine (NICODERM CQ) 21 mg/24hr patch Place 1 patch (21 mg total) onto the skin daily. 06/28/13   Georgia LopesScott M Jensen, DDS  oxyCODONE-acetaminophen (PERCOCET) 10-325 MG per tablet Take 1-2 tablets by mouth every 4 (four) hours as needed for pain. 06/28/13   Georgia LopesScott M Jensen, DDS  traZODone (DESYREL) 150 MG tablet Take 25 mg by mouth at bedtime as needed for sleep.     Historical Provider, MD   BP 137/94  Pulse 100  Temp(Src) 97.7 F (36.5 C) (Oral)  Resp 18  Ht 6\' 3"  (1.905 m)  Wt 206 lb (93.441 kg)  BMI 25.75 kg/m2  SpO2 93% Physical Exam  Nursing note and vitals reviewed. Constitutional: He is oriented to person, place, and time. He appears well-developed and well-nourished. No distress.  HENT:  Head: Normocephalic and atraumatic.  Right Ear: Hearing normal.  Left Ear: Hearing normal.  Nose: Nose normal.  Mouth/Throat: Oropharynx is clear and moist and mucous membranes are normal.  Eyes: Conjunctivae and EOM are normal. Pupils are equal, round, and reactive to light.  Neck: Normal range of motion. Neck supple. No  JVD present. No tracheal deviation present.  Mild tenderness in lower cervical spine. Right paracervical muscle spasm.  Cardiovascular: Normal rate, regular rhythm, S1 normal, S2 normal and normal heart sounds.  Exam reveals no friction rub.   No murmur heard. Pulmonary/Chest: Effort normal and breath sounds normal. No respiratory distress. He has no wheezes. He has no rales.  Abdominal: Soft. Normal appearance and bowel sounds are normal. He exhibits no distension and no mass. There is no hepatosplenomegaly. There is no tenderness. There is no tenderness at McBurney's point and negative Murphy's sign. No hernia.  Musculoskeletal: Normal range of  motion. He exhibits tenderness. He exhibits no edema.  Moderate tenderness to right shoulder area. Mild tenderness in right upper arm. Full ROM. NV intact.  Lymphadenopathy:    He has no cervical adenopathy.  Neurological: He is alert and oriented to person, place, and time. He has normal strength. No cranial nerve deficit or sensory deficit. Coordination normal. GCS eye subscore is 4. GCS verbal subscore is 5. GCS motor subscore is 6.  Skin: Skin is warm, dry and intact. No rash noted. No cyanosis.  Psychiatric: He has a normal mood and affect. His speech is normal and behavior is normal. Thought content normal.    ED Course  Procedures (including critical care time) DIAGNOSTIC STUDIES: Oxygen Saturation is 93% on RA, normal by my interpretation.    COORDINATION OF CARE: 11:12 PM- Pt advised of plan for treatment which includes medication and pt agrees.   MDM   Final diagnoses:  Pain of right upper extremity    Right-sided neck, shoulder, upper arm pain and pattern most consistent with cervical radiculopathy. He does show some evidence of muscle spasm in the paracervical area. There are no objective motor or sensory deficits. I do not see an indication for any imaging studies to be done tonight. Old records are reviewed and he has had MRI of thoracic and lumbar areas of to know prior imaging of his neck. He'll be discharged with prescriptions for naproxen, orphenadrine, and oxycodone acetaminophen and is referred back to his PCP for followup in one week.  I personally performed the services described in this documentation, which was scribed in my presence. The recorded information has been reviewed and is accurate.      Kirk Booze, MD 02/01/14 0981  Kirk Booze, MD 02/01/14 [phone]

## 2014-02-01 NOTE — ED Notes (Signed)
Pt complaining of right arm pain, started 3 days ago, denies injury. Girlfriend says he was crying, so she brought him to ED.

## 2014-02-01 NOTE — Discharge Instructions (Signed)
Cervical Radiculopathy Cervical radiculopathy happens when a nerve in the neck is pinched or bruised by a slipped (herniated) disk or by arthritic changes in the bones of the cervical spine. This can occur due to an injury or as part of the normal aging process. Pressure on the cervical nerves can cause pain or numbness that runs from your neck all the way down into your arm and fingers. CAUSES  There are many possible causes, including:  Injury.  Muscle tightness in the neck from overuse.  Swollen, painful joints (arthritis).  Breakdown or degeneration in the bones and joints of the spine (spondylosis) due to aging.  Bone spurs that may develop near the cervical nerves. SYMPTOMS  Symptoms include pain, weakness, or numbness in the affected arm and hand. Pain can be severe or irritating. Symptoms may be worse when extending or turning the neck. DIAGNOSIS  Your caregiver will ask about your symptoms and do a physical exam. He or she may test your strength and reflexes. X-rays, CT scans, and MRI scans may be needed in cases of injury or if the symptoms do not go away after a period of time. Electromyography (EMG) or nerve conduction testing may be done to study how your nerves and muscles are working. TREATMENT  Your caregiver may recommend certain exercises to help relieve your symptoms. Cervical radiculopathy can, and often does, get better with time and treatment. If your problems continue, treatment options may include:  Wearing a soft collar for short periods of time.  Physical therapy to strengthen the neck muscles.  Medicines, such as nonsteroidal anti-inflammatory drugs (NSAIDs), oral corticosteroids, or spinal injections.  Surgery. Different types of surgery may be done depending on the cause of your problems. HOME CARE INSTRUCTIONS   Put ice on the affected area.  Put ice in a plastic bag.  Place a towel between your skin and the bag.  Leave the ice on for 15-20 minutes,  03-04 times a day or as directed by your caregiver.  If ice does not help, you can try using heat. Take a warm shower or bath, or use a hot water bottle as directed by your caregiver.  You may try a gentle neck and shoulder massage.  Use a flat pillow when you sleep.  Only take over-the-counter or prescription medicines for pain, discomfort, or fever as directed by your caregiver.  If physical therapy was prescribed, follow your caregiver's directions.  If a soft collar was prescribed, use it as directed. SEEK IMMEDIATE MEDICAL CARE IF:   Your pain gets much worse and cannot be controlled with medicines.  You have weakness or numbness in your hand, arm, face, or leg.  You have a high fever or a stiff, rigid neck.  You lose bowel or bladder control (incontinence).  You have trouble with walking, balance, or speaking. MAKE SURE YOU:   Understand these instructions.  Will watch your condition.  Will get help right away if you are not doing well or get worse. Document Released: 04/02/2001 Document Revised: 09/30/2011 Document Reviewed: 02/19/2011 [facility] Patient Information 2015 [place], [place]. This information is not intended to replace advice given to you by your health care provider. Make sure you discuss any questions you have with your health care provider.  Naproxen and naproxen sodium oral immediate-release tablets What is this medicine? NAPROXEN (na PROX en) is a non-steroidal anti-inflammatory drug (NSAID). It is used to reduce swelling and to treat pain. This medicine may be used for dental pain, headache, or  painful monthly periods. It is also used for painful joint and muscular problems such as arthritis, tendinitis, bursitis, and gout. This medicine may be used for other purposes; ask your health care provider or pharmacist if you have questions. COMMON BRAND NAME(S): Aflaxen, Aleve, Aleve Arthritis, All Day Relief, Anaprox, Anaprox DS, Naprosyn What should I tell my  health care provider before I take this medicine? They need to know if you have any of these conditions: -asthma -cigarette smoker -drink more than 3 alcohol containing drinks a day -heart disease or circulation problems such as heart failure or leg edema (fluid retention) -high blood pressure -kidney disease -liver disease -stomach bleeding or ulcers -an unusual or allergic reaction to naproxen, aspirin, other NSAIDs, other medicines, foods, dyes, or preservatives -pregnant or trying to get pregnant -breast-feeding How should I use this medicine? Take this medicine by mouth with a glass of water. Follow the directions on the prescription label. Take it with food if your stomach gets upset. Try to not lie down for at least 10 minutes after you take it. Take your medicine at regular intervals. Do not take your medicine more often than directed. Long-term, continuous use may increase the risk of heart attack or stroke. A special MedGuide will be given to you by the pharmacist with each prescription and refill. Be sure to read this information carefully each time. Talk to your pediatrician regarding the use of this medicine in children. Special care may be needed. Overdosage: If you think you have taken too much of this medicine contact a poison control center or emergency room at once. NOTE: This medicine is only for you. Do not share this medicine with others. What if I miss a dose? If you miss a dose, take it as soon as you can. If it is almost time for your next dose, take only that dose. Do not take double or extra doses. What may interact with this medicine? -alcohol -aspirin -cidofovir -diuretics -lithium -methotrexate -other drugs for inflammation like ketorolac or prednisone -pemetrexed -probenecid -warfarin This list may not describe all possible interactions. Give your health care provider a list of all the medicines, herbs, non-prescription drugs, or dietary supplements you  use. Also tell them if you smoke, drink alcohol, or use illegal drugs. Some items may interact with your medicine. What should I watch for while using this medicine? Tell your doctor or health care professional if your pain does not get better. Talk to your doctor before taking another medicine for pain. Do not treat yourself. This medicine does not prevent heart attack or stroke. In fact, this medicine may increase the chance of a heart attack or stroke. The chance may increase with longer use of this medicine and in people who have heart disease. If you take aspirin to prevent heart attack or stroke, talk with your doctor or health care professional. Do not take other medicines that contain aspirin, ibuprofen, or naproxen with this medicine. Side effects such as stomach upset, nausea, or ulcers may be more likely to occur. Many medicines available without a prescription should not be taken with this medicine. This medicine can cause ulcers and bleeding in the stomach and intestines at any time during treatment. Do not smoke cigarettes or drink alcohol. These increase irritation to your stomach and can make it more susceptible to damage from this medicine. Ulcers and bleeding can happen without warning symptoms and can cause death. You may get drowsy or dizzy. Do not drive, use machinery, or do  anything that needs mental alertness until you know how this medicine affects you. Do not stand or sit up quickly, especially if you are an older patient. This reduces the risk of dizzy or fainting spells. This medicine can cause you to bleed more easily. Try to avoid damage to your teeth and gums when you brush or floss your teeth. What side effects may I notice from receiving this medicine? Side effects that you should report to your doctor or health care professional as soon as possible: -black or bloody stools, blood in the urine or vomit -blurred vision -chest pain -difficulty breathing or wheezing -nausea  or vomiting -severe stomach pain -skin rash, skin redness, blistering or peeling skin, hives, or itching -slurred speech or weakness on one side of the body -swelling of eyelids, throat, lips -unexplained weight gain or swelling -unusually weak or tired -yellowing of eyes or skin Side effects that usually do not require medical attention (report to your doctor or health care professional if they continue or are bothersome): -constipation -headache -heartburn This list may not describe all possible side effects. Call your doctor for medical advice about side effects. You may report side effects to FDA at 1-800-FDA-1088. Where should I keep my medicine? Keep out of the reach of children. Store at room temperature between 15 and 30 degrees C (59 and 86 degrees F). Keep container tightly closed. Throw away any unused medicine after the expiration date. NOTE: This sheet is a summary. It may not cover all possible information. If you have questions about this medicine, talk to your doctor, pharmacist, or health care provider.  2015, Elsevier/Gold Standard. (2009-07-10 20:10:16)  Orphenadrine tablets What is this medicine? ORPHENADRINE (or FEN a dreen) helps to relieve pain and stiffness in muscles and can treat muscle spasms. This medicine may be used for other purposes; ask your health care provider or pharmacist if you have questions. COMMON BRAND NAME(S): Norflex What should I tell my health care provider before I take this medicine? They need to know if you have any of these conditions: -glaucoma -heart disease -kidney disease -myasthenia gravis -peptic ulcer disease -prostate disease -stomach problems -an unusual or allergic reaction to orphenadrine, other medicines, foods, lactose, dyes, or preservatives -pregnant or trying to get pregnant -breast-feeding How should I use this medicine? Take this medicine by mouth with a full glass of water. Follow the directions on the  prescription label. Take your medicine at regular intervals. Do not take your medicine more often than directed. Do not take more than you are told to take. Talk to your pediatrician regarding the use of this medicine in children. Special care may be needed. Patients over 97 years old may have a stronger reaction and need a smaller dose. Overdosage: If you think you have taken too much of this medicine contact a poison control center or emergency room at once. NOTE: This medicine is only for you. Do not share this medicine with others. What if I miss a dose? If you miss a dose, take it as soon as you can. If it is almost time for your next dose, take only that dose. Do not take double or extra doses. What may interact with this medicine? -alcohol -antihistamines -barbiturates, like phenobarbital -benzodiazepines -cyclobenzaprine -medicines for pain -phenothiazines like chlorpromazine, mesoridazine, prochlorperazine, thioridazine This list may not describe all possible interactions. Give your health care provider a list of all the medicines, herbs, non-prescription drugs, or dietary supplements you use. Also tell them if you smoke,  drink alcohol, or use illegal drugs. Some items may interact with your medicine. What should I watch for while using this medicine? Your mouth may get dry. Chewing sugarless gum or sucking hard candy, and drinking plenty of water may help. Contact your doctor if the problem does not go away or is severe. This medicine may cause dry eyes and blurred vision. If you wear contact lenses you may feel some discomfort. Lubricating drops may help. See your eye doctor if the problem does not go away or is severe. You may get drowsy or dizzy. Do not drive, use machinery, or do anything that needs mental alertness until you know how this medicine affects you. Do not stand or sit up quickly, especially if you are an older patient. This reduces the risk of dizzy or fainting spells.  Alcohol may interfere with the effect of this medicine. Avoid alcoholic drinks. What side effects may I notice from receiving this medicine? Side effects that you should report to your doctor or health care professional as soon as possible: -allergic reactions like skin rash, itching or hives, swelling of the face, lips, or tongue -changes in vision -difficulty breathing -fast heartbeat or palpitations -hallucinations -light headedness, fainting spells -vomiting Side effects that usually do not require medical attention (report to your doctor or health care professional if they continue or are bothersome): -dizziness -drowsiness -headache -nausea This list may not describe all possible side effects. Call your doctor for medical advice about side effects. You may report side effects to FDA at 1-800-FDA-1088. Where should I keep my medicine? Keep out of the reach of children. Store at room temperature between 15 and 30 degrees C (59 and 86 degrees F). Protect from light. Keep container tightly closed. Throw away any unused medicine after the expiration date. NOTE: This sheet is a summary. It may not cover all possible information. If you have questions about this medicine, talk to your doctor, pharmacist, or health care provider.  2015, Elsevier/Gold Standard. (2008-02-02 17:19:12)  Acetaminophen; Oxycodone tablets What is this medicine? ACETAMINOPHEN; OXYCODONE (a set a MEE noe fen; ox i KOE done) is a pain reliever. It is used to treat mild to moderate pain. This medicine may be used for other purposes; ask your health care provider or pharmacist if you have questions. COMMON BRAND NAME(S): Endocet, Magnacet, Narvox, Percocet, Perloxx, Primalev, Primlev, Roxicet, Xolox What should I tell my health care provider before I take this medicine? They need to know if you have any of these conditions: -brain tumor -Crohn's disease, inflammatory bowel disease, or ulcerative colitis -drug abuse  or addiction -head injury -heart or circulation problems -if you often drink alcohol -kidney disease or problems going to the bathroom -liver disease -lung disease, asthma, or breathing problems -an unusual or allergic reaction to acetaminophen, oxycodone, other opioid analgesics, other medicines, foods, dyes, or preservatives -pregnant or trying to get pregnant -breast-feeding How should I use this medicine? Take this medicine by mouth with a full glass of water. Follow the directions on the prescription label. Take your medicine at regular intervals. Do not take your medicine more often than directed. Talk to your pediatrician regarding the use of this medicine in children. Special care may be needed. Patients over 66 years old may have a stronger reaction and need a smaller dose. Overdosage: If you think you have taken too much of this medicine contact a poison control center or emergency room at once. NOTE: This medicine is only for you. Do not share  this medicine with others. What if I miss a dose? If you miss a dose, take it as soon as you can. If it is almost time for your next dose, take only that dose. Do not take double or extra doses. What may interact with this medicine? -alcohol -antihistamines -barbiturates like amobarbital, butalbital, butabarbital, methohexital, pentobarbital, phenobarbital, thiopental, and secobarbital -benztropine -drugs for bladder problems like solifenacin, trospium, oxybutynin, tolterodine, hyoscyamine, and methscopolamine -drugs for breathing problems like ipratropium and tiotropium -drugs for certain stomach or intestine problems like propantheline, homatropine methylbromide, glycopyrrolate, atropine, belladonna, and dicyclomine -general anesthetics like etomidate, ketamine, nitrous oxide, propofol, desflurane, enflurane, halothane, isoflurane, and sevoflurane -medicines for depression, anxiety, or psychotic disturbances -medicines for  sleep -muscle relaxants -naltrexone -narcotic medicines (opiates) for pain -phenothiazines like perphenazine, thioridazine, chlorpromazine, mesoridazine, fluphenazine, prochlorperazine, promazine, and trifluoperazine -scopolamine -tramadol -trihexyphenidyl This list may not describe all possible interactions. Give your health care provider a list of all the medicines, herbs, non-prescription drugs, or dietary supplements you use. Also tell them if you smoke, drink alcohol, or use illegal drugs. Some items may interact with your medicine. What should I watch for while using this medicine? Tell your doctor or health care professional if your pain does not go away, if it gets worse, or if you have new or a different type of pain. You may develop tolerance to the medicine. Tolerance means that you will need a higher dose of the medication for pain relief. Tolerance is normal and is expected if you take this medicine for a long time. Do not suddenly stop taking your medicine because you may develop a severe reaction. Your body becomes used to the medicine. This does NOT mean you are addicted. Addiction is a behavior related to getting and using a drug for a non-medical reason. If you have pain, you have a medical reason to take pain medicine. Your doctor will tell you how much medicine to take. If your doctor wants you to stop the medicine, the dose will be slowly lowered over time to avoid any side effects. You may get drowsy or dizzy. Do not drive, use machinery, or do anything that needs mental alertness until you know how this medicine affects you. Do not stand or sit up quickly, especially if you are an older patient. This reduces the risk of dizzy or fainting spells. Alcohol may interfere with the effect of this medicine. Avoid alcoholic drinks. There are different types of narcotic medicines (opiates) for pain. If you take more than one type at the same time, you may have more side effects. Give your  health care provider a list of all medicines you use. Your doctor will tell you how much medicine to take. Do not take more medicine than directed. Call emergency for help if you have problems breathing. The medicine will cause constipation. Try to have a bowel movement at least every 2 to 3 days. If you do not have a bowel movement for 3 days, call your doctor or health care professional. Do not take Tylenol (acetaminophen) or medicines that have acetaminophen with this medicine. Too much acetaminophen can be very dangerous. Many nonprescription medicines contain acetaminophen. Always read the labels carefully to avoid taking more acetaminophen. What side effects may I notice from receiving this medicine? Side effects that you should report to your doctor or health care professional as soon as possible: -allergic reactions like skin rash, itching or hives, swelling of the face, lips, or tongue -breathing difficulties, wheezing -confusion -light headedness  or fainting spells -severe stomach pain -unusually weak or tired -yellowing of the skin or the whites of the eyes Side effects that usually do not require medical attention (report to your doctor or health care professional if they continue or are bothersome): -dizziness -drowsiness -nausea -vomiting This list may not describe all possible side effects. Call your doctor for medical advice about side effects. You may report side effects to FDA at 1-800-FDA-1088. Where should I keep my medicine? Keep out of the reach of children. This medicine can be abused. Keep your medicine in a safe place to protect it from theft. Do not share this medicine with anyone. Selling or giving away this medicine is dangerous and against the law. Store at room temperature between 20 and 25 degrees C (68 and 77 degrees F). Keep container tightly closed. Protect from light. This medicine may cause accidental overdose and death if it is taken by other adults,  children, or pets. Flush any unused medicine down the toilet to reduce the chance of harm. Do not use the medicine after the expiration date. NOTE: This sheet is a summary. It may not cover all possible information. If you have questions about this medicine, talk to your doctor, pharmacist, or health care provider.  2015, Elsevier/Gold Standard. (2013-03-01 13:17:35)

## 2014-02-07 MED FILL — Oxycodone w/ Acetaminophen Tab 5-325 MG: ORAL | Qty: 6 | Status: AC

## 2014-02-18 ENCOUNTER — Ambulatory Visit (HOSPITAL_COMMUNITY)
Admission: RE | Admit: 2014-02-18 | Discharge: 2014-02-18 | Disposition: A | Discharge status: Home / Self Care | Payer: Medicaid Other | Source: Ambulatory Visit | Attending: Family Medicine | Admitting: Family Medicine

## 2014-02-18 ENCOUNTER — Other Ambulatory Visit (HOSPITAL_COMMUNITY): Payer: Self-pay | Admitting: Family Medicine

## 2014-02-18 DIAGNOSIS — IMO0002 Reserved for concepts with insufficient information to code with codable children: Secondary | ICD-10-CM | POA: Insufficient documentation

## 2014-02-18 DIAGNOSIS — I658 Occlusion and stenosis of other precerebral arteries: Secondary | ICD-10-CM | POA: Diagnosis not present

## 2014-02-18 DIAGNOSIS — I6529 Occlusion and stenosis of unspecified carotid artery: Secondary | ICD-10-CM | POA: Insufficient documentation

## 2014-02-18 DIAGNOSIS — M1710 Unilateral primary osteoarthritis, unspecified knee: Secondary | ICD-10-CM | POA: Diagnosis not present

## 2014-02-18 DIAGNOSIS — M5137 Other intervertebral disc degeneration, lumbosacral region: Secondary | ICD-10-CM

## 2014-02-18 DIAGNOSIS — M5030 Other cervical disc degeneration, unspecified cervical region: Secondary | ICD-10-CM | POA: Diagnosis not present

## 2014-02-18 DIAGNOSIS — M1711 Unilateral primary osteoarthritis, right knee: Secondary | ICD-10-CM

## 2014-02-18 DIAGNOSIS — M51379 Other intervertebral disc degeneration, lumbosacral region without mention of lumbar back pain or lower extremity pain: Secondary | ICD-10-CM | POA: Insufficient documentation

## 2014-06-09 ENCOUNTER — Other Ambulatory Visit (HOSPITAL_COMMUNITY): Payer: Self-pay | Admitting: Family Medicine

## 2014-06-09 DIAGNOSIS — M5441 Lumbago with sciatica, right side: Secondary | ICD-10-CM

## 2014-06-14 ENCOUNTER — Ambulatory Visit (HOSPITAL_COMMUNITY)
Admission: RE | Admit: 2014-06-14 | Discharge: 2014-06-14 | Disposition: A | Discharge status: Home / Self Care | Payer: Medicaid Other | Source: Ambulatory Visit | Attending: Family Medicine | Admitting: Family Medicine

## 2014-06-14 DIAGNOSIS — M5441 Lumbago with sciatica, right side: Secondary | ICD-10-CM

## 2014-06-14 DIAGNOSIS — R200 Anesthesia of skin: Secondary | ICD-10-CM | POA: Diagnosis not present

## 2014-06-14 DIAGNOSIS — G8929 Other chronic pain: Secondary | ICD-10-CM | POA: Insufficient documentation

## 2014-06-14 DIAGNOSIS — M4806 Spinal stenosis, lumbar region: Secondary | ICD-10-CM | POA: Diagnosis not present

## 2014-06-14 DIAGNOSIS — M545 Low back pain: Secondary | ICD-10-CM | POA: Diagnosis present

## 2014-06-14 DIAGNOSIS — M5126 Other intervertebral disc displacement, lumbar region: Secondary | ICD-10-CM | POA: Insufficient documentation

## 2015-08-16 ENCOUNTER — Other Ambulatory Visit: Payer: Self-pay | Admitting: Neurosurgery

## 2015-08-16 DIAGNOSIS — M5416 Radiculopathy, lumbar region: Secondary | ICD-10-CM

## 2015-08-16 DIAGNOSIS — M5412 Radiculopathy, cervical region: Secondary | ICD-10-CM

## 2015-08-17 ENCOUNTER — Other Ambulatory Visit: Payer: Self-pay | Admitting: Neurosurgery

## 2015-08-28 ENCOUNTER — Ambulatory Visit
Admission: RE | Admit: 2015-08-28 | Discharge: 2015-08-28 | Disposition: A | Discharge status: Home / Self Care | Payer: Medicaid Other | Source: Ambulatory Visit | Attending: Neurosurgery | Admitting: Neurosurgery

## 2015-08-28 VITALS — BP 160/98 | HR 69

## 2015-08-28 DIAGNOSIS — M5416 Radiculopathy, lumbar region: Secondary | ICD-10-CM

## 2015-08-28 DIAGNOSIS — M5412 Radiculopathy, cervical region: Secondary | ICD-10-CM

## 2015-08-28 DIAGNOSIS — M519 Unspecified thoracic, thoracolumbar and lumbosacral intervertebral disc disorder: Secondary | ICD-10-CM

## 2015-08-28 MED ORDER — OXYCODONE-ACETAMINOPHEN 5-325 MG PO TABS
2.0000 | ORAL_TABLET | Freq: Once | ORAL | Status: AC
Start: 2015-08-28 — End: 2015-08-28
  Administered 2015-08-28: 2 via ORAL

## 2015-08-28 MED ORDER — IOHEXOL 300 MG/ML  SOLN
10.0000 mL | Freq: Once | INTRAMUSCULAR | Status: AC | PRN
  Administered 2015-08-28: 10 mL via INTRATHECAL

## 2015-08-28 MED ORDER — DIAZEPAM 5 MG PO TABS
10.0000 mg | ORAL_TABLET | Freq: Once | ORAL | Status: AC
  Administered 2015-08-28: 10 mg via ORAL

## 2015-08-28 NOTE — Discharge Instructions (Signed)

## 2015-09-12 ENCOUNTER — Other Ambulatory Visit: Payer: Self-pay | Admitting: Neurosurgery

## 2015-09-21 ENCOUNTER — Encounter (HOSPITAL_COMMUNITY)
Admission: RE | Admit: 2015-09-21 | Discharge: 2015-09-21 | Disposition: A | Discharge status: Home / Self Care | Payer: Medicaid Other | Source: Ambulatory Visit | Attending: Neurosurgery | Admitting: Neurosurgery

## 2015-09-21 ENCOUNTER — Encounter (HOSPITAL_COMMUNITY): Payer: Self-pay

## 2015-09-21 DIAGNOSIS — R9431 Abnormal electrocardiogram [ECG] [EKG]: Secondary | ICD-10-CM | POA: Insufficient documentation

## 2015-09-21 DIAGNOSIS — Z01818 Encounter for other preprocedural examination: Secondary | ICD-10-CM | POA: Diagnosis present

## 2015-09-21 DIAGNOSIS — Z01812 Encounter for preprocedural laboratory examination: Secondary | ICD-10-CM | POA: Insufficient documentation

## 2015-09-21 DIAGNOSIS — M4806 Spinal stenosis, lumbar region: Secondary | ICD-10-CM | POA: Insufficient documentation

## 2015-09-21 LAB — COMPREHENSIVE METABOLIC PANEL
ALT: 52 U/L (ref 17–63)
AST: 76 U/L — AB (ref 15–41)
Albumin: 4.1 g/dL (ref 3.5–5.0)
Alkaline Phosphatase: 75 U/L (ref 38–126)
Anion gap: 14 (ref 5–15)
BUN: 10 mg/dL (ref 6–20)
CALCIUM: 9.3 mg/dL (ref 8.9–10.3)
CHLORIDE: 103 mmol/L (ref 101–111)
CO2: 22 mmol/L (ref 22–32)
Creatinine, Ser: 0.41 mg/dL — ABNORMAL LOW (ref 0.61–1.24)
GFR calc Af Amer: >60 mL/min (ref >60)
GFR calc non Af Amer: >60 mL/min (ref >60)
Glucose, Bld: 100 mg/dL — ABNORMAL HIGH (ref 65–99)
POTASSIUM: 4.1 mmol/L (ref 3.5–5.1)
SODIUM: 139 mmol/L (ref 135–145)
TOTAL PROTEIN: 7.2 g/dL (ref 6.5–8.1)
Total Bilirubin: 0.8 mg/dL (ref 0.3–1.2)

## 2015-09-21 LAB — CBC
HCT: 45.8 % (ref 39.0–52.0)
Hemoglobin: 15.9 g/dL (ref 13.0–17.0)
MCH: 35.7 pg — ABNORMAL HIGH (ref 26.0–34.0)
MCHC: 34.7 g/dL (ref 30.0–36.0)
MCV: 102.9 fL — ABNORMAL HIGH (ref 78.0–100.0)
PLATELETS: 193 10*3/uL (ref 150–400)
RBC: 4.45 MIL/uL (ref 4.22–5.81)
RDW: 14.7 % (ref 11.5–15.5)
WBC: 9 10*3/uL (ref 4.0–10.5)

## 2015-09-21 LAB — SURGICAL PCR SCREEN
MRSA, PCR: NEGATIVE
STAPHYLOCOCCUS AUREUS: NEGATIVE

## 2015-09-21 NOTE — Pre-Procedure Instructions (Signed)
    Kirk Harper  09/21/2015      [facility] - [place], [place] - [address]  [place] [address] Phone: [phone] Fax: [phone]    Your procedure is scheduled on Tuesday March 7th at 9am  Report to [facility] Admitting at 6 am  Call this number if you have problems the morning of surgery:  [phone]   Remember:  Do not eat food or drink liquids after midnight.  Take these medicines the morning of surgery with A SIP OF WATER  Pantoprazole (Protonix) Pain pill  Stop taking aspirin, aspirin containing products, Nsaids, Ibuprofen (advil, Motrin), Naproxin (Aleve), vitamins and herbal products    Do not wear jewelry  Do not wear lotions, powders, or perfumes.  You may wear deodorant.  Do not shave 48 hours prior to surgery.  Men may shave face and neck.   Do not bring valuables to the hospital.  [facility] is not responsible for any belongings or valuables.  Contacts, dentures or bridgework may not be worn into surgery.  Leave your suitcase in the car.  After surgery it may be brought to your room.  For patients admitted to the hospital, discharge time will be determined by your treatment team.  Patients discharged the day of surgery will not be allowed to drive home.    Please read over the following fact sheets that you were given. Pain Booklet, Coughing and Deep Breathing, MRSA Information and Surgical Site Infection Prevention

## 2015-09-21 NOTE — Progress Notes (Signed)
Pt does not read or write.  Health history and instructions reviewed with girlfriend, Eunice Blase.  Does not see a cardiologist.  PCP  Dr Maximino Greenland- [facility] Denies any cardiac studies in past.

## 2015-09-25 NOTE — H&P (Signed)
Kirk Harper is an 55 y.o. male.   Chief Complaint: bilateral leg pain HPI: patient seen in the office since 2014 with lower back pain and later pain to both legs  As well as neck pain and weakness of both hands no better with conservative treatment.  Past Medical History  Diagnosis Date  . Bipolar affective (HCC)   . Hypertension   . DDD (degenerative disc disease), lumbar   . Back pain   . Paranoia (HCC)   . Pancreatitis   . GERD (gastroesophageal reflux disease)     takes tums prn  . Alcohol abuse     Past Surgical History  Procedure Laterality Date  . Spleen  spleen removed  . Splenectomy    . Multiple extractions with alveoloplasty N/A 06/28/2013    Procedure: MULTIPLE EXTRACTION WITH ALVEOLOPLASTY AND TORI ;  Surgeon: Georgia LopesScott Harper Jensen, DDS;  Location: [facility] OR;  Service: Oral Surgery;  Laterality: N/A;    No family history on file. Social History:  reports that he has been smoking Cigarettes.  He has a 20 pack-year smoking history. He does not have any smokeless tobacco history on file. He reports that he drinks about 36.0 oz of alcohol per week. He reports that he does not use illicit drugs.  Allergies: No Known Allergies  No prescriptions prior to admission    No results found for this or any previous visit (from the past 48 hour(s)). No results found.  Review of Systems  HENT: Negative.   Eyes: Negative.   Respiratory: Positive for shortness of breath.   Cardiovascular: Negative.   Gastrointestinal: Negative.   Genitourinary: Negative.   Musculoskeletal: Positive for back pain.  Skin: Negative.   Neurological: Positive for sensory change and focal weakness.  Endo/Heme/Allergies: Negative.   Psychiatric/Behavioral: Negative.     There were no vitals taken for this visit. Physical Exam  Hent, nl. Neck pain with mobilization. Cv, nl. Lungs, some ronchii bilaterally. Abdomen, nl. Extremities no edema. NEURO weakness of DF both feet. Sensory, nl but complains of  burning sensation to the left hand. 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 Cervical spondylosis from c5 to t1 Assessment/Plan First surgery will be decompression from l3 to s1 bilataral. He and his wife are aware of risks and benefits  Karn CassisBOTERO,Kirk Aristizabal M, MD 09/25/2015, 7:10 PM

## 2015-09-26 ENCOUNTER — Encounter (HOSPITAL_COMMUNITY)
Admission: RE | Disposition: A | Discharge status: Home / Self Care | Payer: Self-pay | Source: Ambulatory Visit | Attending: Neurosurgery

## 2015-09-26 ENCOUNTER — Inpatient Hospital Stay (HOSPITAL_COMMUNITY): Payer: Medicaid Other | Admitting: Anesthesiology

## 2015-09-26 ENCOUNTER — Inpatient Hospital Stay (HOSPITAL_COMMUNITY): Payer: Medicaid Other

## 2015-09-26 ENCOUNTER — Encounter (HOSPITAL_COMMUNITY): Payer: Self-pay | Admitting: General Practice

## 2015-09-26 ENCOUNTER — Inpatient Hospital Stay (HOSPITAL_COMMUNITY)
Admission: RE | Admit: 2015-09-26 | Discharge: 2015-09-28 | DRG: 460 | Disposition: A | Discharge status: Home / Self Care | Payer: Medicaid Other | Source: Ambulatory Visit | Attending: Neurosurgery | Admitting: Neurosurgery

## 2015-09-26 DIAGNOSIS — M4806 Spinal stenosis, lumbar region: Secondary | ICD-10-CM | POA: Diagnosis present

## 2015-09-26 DIAGNOSIS — F22 Delusional disorders: Secondary | ICD-10-CM | POA: Diagnosis present

## 2015-09-26 DIAGNOSIS — K219 Gastro-esophageal reflux disease without esophagitis: Secondary | ICD-10-CM | POA: Diagnosis present

## 2015-09-26 DIAGNOSIS — F17210 Nicotine dependence, cigarettes, uncomplicated: Secondary | ICD-10-CM | POA: Diagnosis present

## 2015-09-26 DIAGNOSIS — I739 Peripheral vascular disease, unspecified: Secondary | ICD-10-CM | POA: Diagnosis present

## 2015-09-26 DIAGNOSIS — M5416 Radiculopathy, lumbar region: Secondary | ICD-10-CM | POA: Diagnosis present

## 2015-09-26 DIAGNOSIS — M48062 Spinal stenosis, lumbar region with neurogenic claudication: Secondary | ICD-10-CM | POA: Diagnosis present

## 2015-09-26 DIAGNOSIS — I10 Essential (primary) hypertension: Secondary | ICD-10-CM | POA: Diagnosis present

## 2015-09-26 DIAGNOSIS — M79606 Pain in leg, unspecified: Secondary | ICD-10-CM | POA: Diagnosis present

## 2015-09-26 DIAGNOSIS — F319 Bipolar disorder, unspecified: Secondary | ICD-10-CM | POA: Diagnosis present

## 2015-09-26 DIAGNOSIS — M4807 Spinal stenosis, lumbosacral region: Secondary | ICD-10-CM | POA: Diagnosis present

## 2015-09-26 DIAGNOSIS — F17200 Nicotine dependence, unspecified, uncomplicated: Secondary | ICD-10-CM | POA: Diagnosis present

## 2015-09-26 DIAGNOSIS — M549 Dorsalgia, unspecified: Secondary | ICD-10-CM

## 2015-09-26 HISTORY — PX: LUMBAR LAMINECTOMY/DECOMPRESSION MICRODISCECTOMY: SHX5026

## 2015-09-26 SURGERY — LUMBAR LAMINECTOMY/DECOMPRESSION MICRODISCECTOMY 3 LEVELS
Anesthesia: General | Site: Back

## 2015-09-26 MED ORDER — HYDROMORPHONE HCL 1 MG/ML IJ SOLN: 0.2500 mg | INTRAMUSCULAR | Status: DC | PRN

## 2015-09-26 MED ORDER — MIDAZOLAM HCL 2 MG/2ML IJ SOLN
INTRAMUSCULAR | Status: AC
  Filled 2015-09-26: qty 2

## 2015-09-26 MED ORDER — SODIUM CHLORIDE 0.9 % IV SOLN: 250.0000 mL | INTRAVENOUS | Status: DC

## 2015-09-26 MED ORDER — PHENYLEPHRINE HCL 10 MG/ML IJ SOLN
INTRAMUSCULAR | Status: DC | PRN
  Administered 2015-09-26 (×2): 80 ug via INTRAVENOUS

## 2015-09-26 MED ORDER — DIPHENHYDRAMINE HCL 12.5 MG/5ML PO ELIX: 12.5000 mg | ORAL_SOLUTION | Freq: Four times a day (QID) | ORAL | Status: DC | PRN

## 2015-09-26 MED ORDER — LORAZEPAM 1 MG PO TABS: 1.0000 mg | ORAL_TABLET | Freq: Four times a day (QID) | ORAL | Status: DC | PRN

## 2015-09-26 MED ORDER — ONDANSETRON HCL 4 MG/2ML IJ SOLN
INTRAMUSCULAR | Status: AC
  Filled 2015-09-26: qty 2

## 2015-09-26 MED ORDER — STERILE WATER FOR INJECTION IJ SOLN
INTRAMUSCULAR | Status: AC
  Filled 2015-09-26: qty 10

## 2015-09-26 MED ORDER — ZOLPIDEM TARTRATE 5 MG PO TABS: 5.0000 mg | ORAL_TABLET | Freq: Every evening | ORAL | Status: DC | PRN

## 2015-09-26 MED ORDER — DEXAMETHASONE SODIUM PHOSPHATE 4 MG/ML IJ SOLN
INTRAMUSCULAR | Status: DC | PRN
  Administered 2015-09-26: 4 mg via INTRAVENOUS

## 2015-09-26 MED ORDER — ROCURONIUM BROMIDE 50 MG/5ML IV SOLN
INTRAVENOUS | Status: AC
  Filled 2015-09-26: qty 1

## 2015-09-26 MED ORDER — LORAZEPAM 2 MG/ML IJ SOLN: 1.0000 mg | Freq: Four times a day (QID) | INTRAMUSCULAR | Status: DC | PRN

## 2015-09-26 MED ORDER — MENTHOL 3 MG MT LOZG: 1.0000 | LOZENGE | OROMUCOSAL | Status: DC | PRN

## 2015-09-26 MED ORDER — ADULT MULTIVITAMIN W/MINERALS CH
1.0000 | ORAL_TABLET | Freq: Every day | ORAL | Status: DC
  Administered 2015-09-26 – 2015-09-28 (×3): 1 via ORAL
  Filled 2015-09-26 (×3): qty 1

## 2015-09-26 MED ORDER — PROPOFOL 10 MG/ML IV BOLUS
INTRAVENOUS | Status: AC
  Filled 2015-09-26: qty 40

## 2015-09-26 MED ORDER — HEMOSTATIC AGENTS (NO CHARGE) OPTIME
TOPICAL | Status: DC | PRN
  Administered 2015-09-26: 1 via TOPICAL

## 2015-09-26 MED ORDER — EPHEDRINE SULFATE 50 MG/ML IJ SOLN
INTRAMUSCULAR | Status: DC | PRN
  Administered 2015-09-26: 5 mg via INTRAVENOUS

## 2015-09-26 MED ORDER — LACTATED RINGERS IV SOLN
INTRAVENOUS | Status: DC
  Administered 2015-09-26 (×2): via INTRAVENOUS

## 2015-09-26 MED ORDER — PROPOFOL 10 MG/ML IV BOLUS
INTRAVENOUS | Status: DC | PRN
  Administered 2015-09-26: 250 mg via INTRAVENOUS

## 2015-09-26 MED ORDER — ARTIFICIAL TEARS OP OINT
TOPICAL_OINTMENT | OPHTHALMIC | Status: AC
  Filled 2015-09-26: qty 3.5

## 2015-09-26 MED ORDER — LIDOCAINE HCL (CARDIAC) 20 MG/ML IV SOLN
INTRAVENOUS | Status: DC | PRN
  Administered 2015-09-26: 70 mg via INTRAVENOUS

## 2015-09-26 MED ORDER — SODIUM CHLORIDE 0.9% FLUSH
3.0000 mL | Freq: Two times a day (BID) | INTRAVENOUS | Status: DC
  Administered 2015-09-26 – 2015-09-27 (×2): 3 mL via INTRAVENOUS

## 2015-09-26 MED ORDER — DIPHENHYDRAMINE HCL 50 MG/ML IJ SOLN: 12.5000 mg | Freq: Four times a day (QID) | INTRAMUSCULAR | Status: DC | PRN

## 2015-09-26 MED ORDER — CEFAZOLIN SODIUM-DEXTROSE 2-3 GM-% IV SOLR
2.0000 g | INTRAVENOUS | Status: AC
  Administered 2015-09-26: 2 g via INTRAVENOUS
  Filled 2015-09-26: qty 50

## 2015-09-26 MED ORDER — CEFAZOLIN SODIUM-DEXTROSE 2-3 GM-% IV SOLR
2.0000 g | Freq: Three times a day (TID) | INTRAVENOUS | Status: AC
  Administered 2015-09-26 – 2015-09-27 (×2): 2 g via INTRAVENOUS
  Filled 2015-09-26 (×2): qty 50

## 2015-09-26 MED ORDER — VITAMIN B-1 100 MG PO TABS
100.0000 mg | ORAL_TABLET | Freq: Every day | ORAL | Status: DC
  Administered 2015-09-26 – 2015-09-28 (×3): 100 mg via ORAL
  Filled 2015-09-26 (×3): qty 1

## 2015-09-26 MED ORDER — LIDOCAINE HCL (CARDIAC) 20 MG/ML IV SOLN
INTRAVENOUS | Status: AC
  Filled 2015-09-26: qty 5

## 2015-09-26 MED ORDER — ONDANSETRON HCL 4 MG/2ML IJ SOLN
INTRAMUSCULAR | Status: DC | PRN
  Administered 2015-09-26: 4 mg via INTRAVENOUS

## 2015-09-26 MED ORDER — ONDANSETRON HCL 4 MG/2ML IJ SOLN
4.0000 mg | INTRAMUSCULAR | Status: DC | PRN
  Administered 2015-09-27: 4 mg via INTRAVENOUS
  Filled 2015-09-26: qty 2

## 2015-09-26 MED ORDER — FENTANYL CITRATE (PF) 250 MCG/5ML IJ SOLN
INTRAMUSCULAR | Status: AC
  Filled 2015-09-26: qty 5

## 2015-09-26 MED ORDER — MIDAZOLAM HCL 5 MG/5ML IJ SOLN
INTRAMUSCULAR | Status: DC | PRN
  Administered 2015-09-26: 2 mg via INTRAVENOUS

## 2015-09-26 MED ORDER — NEOSTIGMINE METHYLSULFATE 10 MG/10ML IV SOLN
INTRAVENOUS | Status: DC | PRN
  Administered 2015-09-26: 3 mg via INTRAVENOUS

## 2015-09-26 MED ORDER — PHENOL 1.4 % MT LIQD
1.0000 | OROMUCOSAL | Status: DC | PRN
Start: 2015-09-26 — End: 2015-09-28

## 2015-09-26 MED ORDER — EPHEDRINE SULFATE 50 MG/ML IJ SOLN
INTRAMUSCULAR | Status: AC
  Filled 2015-09-26: qty 1

## 2015-09-26 MED ORDER — ROCURONIUM BROMIDE 100 MG/10ML IV SOLN
INTRAVENOUS | Status: DC | PRN
  Administered 2015-09-26: 50 mg via INTRAVENOUS

## 2015-09-26 MED ORDER — SODIUM CHLORIDE 0.9% FLUSH: 3.0000 mL | INTRAVENOUS | Status: DC | PRN

## 2015-09-26 MED ORDER — ACETAMINOPHEN 325 MG PO TABS
650.0000 mg | ORAL_TABLET | ORAL | Status: DC | PRN
  Administered 2015-09-26: 650 mg via ORAL
  Filled 2015-09-26: qty 2

## 2015-09-26 MED ORDER — LACTATED RINGERS IV SOLN: INTRAVENOUS | Status: DC

## 2015-09-26 MED ORDER — ACETAMINOPHEN 650 MG RE SUPP: 650.0000 mg | RECTAL | Status: DC | PRN

## 2015-09-26 MED ORDER — MORPHINE SULFATE 2 MG/ML IV SOLN
INTRAVENOUS | Status: AC
Start: 2015-09-26 — End: 2015-09-26
  Filled 2015-09-26: qty 25

## 2015-09-26 MED ORDER — SODIUM CHLORIDE 0.9% FLUSH: 9.0000 mL | INTRAVENOUS | Status: DC | PRN

## 2015-09-26 MED ORDER — THROMBIN 5000 UNITS EX SOLR
CUTANEOUS | Status: DC | PRN
  Administered 2015-09-26 (×2): 5000 [IU] via TOPICAL

## 2015-09-26 MED ORDER — THIAMINE HCL 100 MG/ML IJ SOLN: 100.0000 mg | Freq: Every day | INTRAMUSCULAR | Status: DC

## 2015-09-26 MED ORDER — SENNA 8.6 MG PO TABS
1.0000 | ORAL_TABLET | Freq: Two times a day (BID) | ORAL | Status: DC
  Administered 2015-09-26 – 2015-09-28 (×4): 8.6 mg via ORAL
  Filled 2015-09-26 (×4): qty 1

## 2015-09-26 MED ORDER — PHENYLEPHRINE HCL 10 MG/ML IJ SOLN
10.0000 mg | INTRAVENOUS | Status: DC | PRN
  Administered 2015-09-26: 30 ug/min via INTRAVENOUS

## 2015-09-26 MED ORDER — DIAZEPAM 5 MG PO TABS: 5.0000 mg | ORAL_TABLET | Freq: Four times a day (QID) | ORAL | Status: DC | PRN

## 2015-09-26 MED ORDER — ACETAMINOPHEN 10 MG/ML IV SOLN
INTRAVENOUS | Status: AC
  Administered 2015-09-26: 1000 mg via INTRAVENOUS
  Filled 2015-09-26: qty 100

## 2015-09-26 MED ORDER — FENTANYL CITRATE (PF) 100 MCG/2ML IJ SOLN
INTRAMUSCULAR | Status: DC | PRN
  Administered 2015-09-26 (×5): 50 ug via INTRAVENOUS

## 2015-09-26 MED ORDER — NALOXONE HCL 0.4 MG/ML IJ SOLN: 0.4000 mg | INTRAMUSCULAR | Status: DC | PRN

## 2015-09-26 MED ORDER — GLYCOPYRROLATE 0.2 MG/ML IJ SOLN
INTRAMUSCULAR | Status: DC | PRN
  Administered 2015-09-26: 0.4 mg via INTRAVENOUS

## 2015-09-26 MED ORDER — SODIUM CHLORIDE 0.9 % IV SOLN
INTRAVENOUS | Status: DC
  Administered 2015-09-26 – 2015-09-27 (×2): via INTRAVENOUS

## 2015-09-26 MED ORDER — FOLIC ACID 1 MG PO TABS
1.0000 mg | ORAL_TABLET | Freq: Every day | ORAL | Status: DC
  Administered 2015-09-26 – 2015-09-28 (×3): 1 mg via ORAL
  Filled 2015-09-26 (×3): qty 1

## 2015-09-26 MED ORDER — 0.9 % SODIUM CHLORIDE (POUR BTL) OPTIME
TOPICAL | Status: DC | PRN
  Administered 2015-09-26: 1000 mL

## 2015-09-26 MED ORDER — MORPHINE SULFATE 2 MG/ML IV SOLN
INTRAVENOUS | Status: DC
  Administered 2015-09-26 (×2): via INTRAVENOUS
  Administered 2015-09-26: 27 mg via INTRAVENOUS
  Administered 2015-09-27: 2 mg via INTRAVENOUS
  Administered 2015-09-27: 10.5 mg via INTRAVENOUS
  Administered 2015-09-27 (×2): via INTRAVENOUS
  Administered 2015-09-27: 51.51 mg via INTRAVENOUS
  Administered 2015-09-28: 15 mg via INTRAVENOUS
  Administered 2015-09-28: 01:00:00 via INTRAVENOUS
  Administered 2015-09-28: 28.5 mg via INTRAVENOUS
  Administered 2015-09-28: 22.5 mg via INTRAVENOUS
  Filled 2015-09-26 (×5): qty 25

## 2015-09-26 MED ORDER — ONDANSETRON HCL 4 MG/2ML IJ SOLN: 4.0000 mg | Freq: Four times a day (QID) | INTRAMUSCULAR | Status: DC | PRN

## 2015-09-26 MED ORDER — OXYCODONE-ACETAMINOPHEN 5-325 MG PO TABS: 1.0000 | ORAL_TABLET | ORAL | Status: DC | PRN

## 2015-09-26 SURGICAL SUPPLY — 59 items
APL SKNCLS STERI-STRIP NONHPOA (GAUZE/BANDAGES/DRESSINGS) ×1
BENZOIN TINCTURE PRP APPL 2/3 (GAUZE/BANDAGES/DRESSINGS) ×3 IMPLANT
BLADE CLIPPER SURG (BLADE) IMPLANT
BUR ACORN 6.0 (BURR) ×1 IMPLANT
BUR ACORN 6.0MM (BURR) ×1
BUR MATCHSTICK NEURO 3.0 LAGG (BURR) ×3 IMPLANT
CANISTER SUCT 3000ML PPV (MISCELLANEOUS) ×3 IMPLANT
CLOSURE WOUND 1/2 X4 (GAUZE/BANDAGES/DRESSINGS) ×1
DRAPE LAPAROTOMY 100X72X124 (DRAPES) ×3 IMPLANT
DRAPE MICROSCOPE LEICA (MISCELLANEOUS) ×3 IMPLANT
DRAPE POUCH INSTRU U-SHP 10X18 (DRAPES) ×3 IMPLANT
DRSG OPSITE 4X5.5 SM (GAUZE/BANDAGES/DRESSINGS) ×2 IMPLANT
DRSG OPSITE POSTOP 4X8 (GAUZE/BANDAGES/DRESSINGS) ×2 IMPLANT
DRSG PAD ABDOMINAL 8X10 ST (GAUZE/BANDAGES/DRESSINGS) IMPLANT
DURAPREP 26ML APPLICATOR (WOUND CARE) ×3 IMPLANT
ELECT REM PT RETURN 9FT ADLT (ELECTROSURGICAL) ×3
ELECTRODE REM PT RTRN 9FT ADLT (ELECTROSURGICAL) ×1 IMPLANT
EVACUATOR 3/16  PVC DRAIN (DRAIN) ×2
EVACUATOR 3/16 PVC DRAIN (DRAIN) IMPLANT
GAUZE SPONGE 4X4 12PLY STRL (GAUZE/BANDAGES/DRESSINGS) ×3 IMPLANT
GAUZE SPONGE 4X4 16PLY XRAY LF (GAUZE/BANDAGES/DRESSINGS) IMPLANT
GLOVE BIOGEL M 8.0 STRL (GLOVE) ×3 IMPLANT
GLOVE EXAM NITRILE LRG STRL (GLOVE) IMPLANT
GLOVE EXAM NITRILE MD LF STRL (GLOVE) IMPLANT
GLOVE EXAM NITRILE XL STR (GLOVE) IMPLANT
GLOVE EXAM NITRILE XS STR PU (GLOVE) IMPLANT
GOWN STRL REUS W/ TWL LRG LVL3 (GOWN DISPOSABLE) ×1 IMPLANT
GOWN STRL REUS W/ TWL XL LVL3 (GOWN DISPOSABLE) IMPLANT
GOWN STRL REUS W/TWL 2XL LVL3 (GOWN DISPOSABLE) IMPLANT
GOWN STRL REUS W/TWL LRG LVL3 (GOWN DISPOSABLE) ×3
GOWN STRL REUS W/TWL XL LVL3 (GOWN DISPOSABLE)
KIT BASIN OR (CUSTOM PROCEDURE TRAY) ×3 IMPLANT
KIT ROOM TURNOVER OR (KITS) ×3 IMPLANT
MILL MEDIUM DISP (BLADE) ×2 IMPLANT
NDL HYPO 18GX1.5 BLUNT FILL (NEEDLE) IMPLANT
NDL HYPO 21X1.5 SAFETY (NEEDLE) IMPLANT
NDL HYPO 25X1 1.5 SAFETY (NEEDLE) IMPLANT
NDL SPNL 20GX3.5 QUINCKE YW (NEEDLE) IMPLANT
NEEDLE HYPO 18GX1.5 BLUNT FILL (NEEDLE) IMPLANT
NEEDLE HYPO 21X1.5 SAFETY (NEEDLE) IMPLANT
NEEDLE HYPO 25X1 1.5 SAFETY (NEEDLE) IMPLANT
NEEDLE SPNL 20GX3.5 QUINCKE YW (NEEDLE) IMPLANT
NS IRRIG 1000ML POUR BTL (IV SOLUTION) ×3 IMPLANT
PACK LAMINECTOMY NEURO (CUSTOM PROCEDURE TRAY) ×3 IMPLANT
PAD ARMBOARD 7.5X6 YLW CONV (MISCELLANEOUS) ×9 IMPLANT
PATTIES SURGICAL .5 X1 (DISPOSABLE) ×3 IMPLANT
RUBBERBAND STERILE (MISCELLANEOUS) ×6 IMPLANT
SPONGE LAP 4X18 X RAY DECT (DISPOSABLE) IMPLANT
SPONGE SURGIFOAM ABS GEL SZ50 (HEMOSTASIS) ×3 IMPLANT
STAPLER VISISTAT 35W (STAPLE) ×2 IMPLANT
STRIP CLOSURE SKIN 1/2X4 (GAUZE/BANDAGES/DRESSINGS) ×2 IMPLANT
SUT VIC AB 0 CT1 18XCR BRD8 (SUTURE) ×1 IMPLANT
SUT VIC AB 0 CT1 8-18 (SUTURE) ×6
SUT VIC AB 2-0 CP2 18 (SUTURE) ×5 IMPLANT
SUT VIC AB 3-0 SH 8-18 (SUTURE) ×5 IMPLANT
SYR 5ML LL (SYRINGE) IMPLANT
TOWEL OR 17X24 6PK STRL BLUE (TOWEL DISPOSABLE) ×3 IMPLANT
TOWEL OR 17X26 10 PK STRL BLUE (TOWEL DISPOSABLE) ×3 IMPLANT
WATER STERILE IRR 1000ML POUR (IV SOLUTION) ×3 IMPLANT

## 2015-09-26 NOTE — Anesthesia Postprocedure Evaluation (Signed)
Anesthesia Post Note  Patient: Kirk BuccoGary W Harper  Procedure(s) Performed: Procedure(s) (LRB): L3-4 L4-5 L5-S1 Laminectomy/Foraminotomy (N/A)  Patient location during evaluation: PACU Anesthesia Type: General Level of consciousness: awake and alert Pain management: pain level controlled Vital Signs Assessment: post-procedure vital signs reviewed and stable Respiratory status: spontaneous breathing, nonlabored ventilation, respiratory function stable and patient connected to nasal cannula oxygen Cardiovascular status: blood pressure returned to baseline and stable Postop Assessment: no signs of nausea or vomiting Anesthetic complications: no    Last Vitals:  Filed Vitals:   09/26/15 1245 09/26/15 1403  BP: 143/102 161/111  Pulse: 83 85  Temp: 36.5 C 36.7 C  Resp: 11 13    Last Pain:  Filed Vitals:   09/26/15 1404  PainSc: Asleep                 Velisa Regnier L

## 2015-09-26 NOTE — Progress Notes (Signed)
PCA checked with Ferman Hammingammy Blackburn RN

## 2015-09-26 NOTE — Anesthesia Procedure Notes (Signed)
Procedure Name: Intubation Date/Time: 09/26/2015 9:07 AM Performed by: Margaree MackintoshYACOUB, Alleyah Twombly B Pre-anesthesia Checklist: Patient identified, Emergency Drugs available, Suction available, Patient being monitored and Timeout performed Patient Re-evaluated:Patient Re-evaluated prior to inductionOxygen Delivery Method: Circle system utilized Preoxygenation: Pre-oxygenation with 100% oxygen Intubation Type: IV induction Ventilation: Mask ventilation without difficulty and Oral airway inserted - appropriate to patient size Laryngoscope Size: Mac and 4 Grade View: Grade II Tube type: Oral Tube size: 7.5 mm Number of attempts: 1 Airway Equipment and Method: Stylet Placement Confirmation: ETT inserted through vocal cords under direct vision,  positive ETCO2 and breath sounds checked- equal and bilateral Secured at: 22 cm Tube secured with: Tape Dental Injury: Teeth and Oropharynx as per pre-operative assessment

## 2015-09-26 NOTE — Anesthesia Preprocedure Evaluation (Signed)
Anesthesia Evaluation  Patient identified by MRN, date of birth, ID band Patient awake    Reviewed: Allergy & Precautions, H&P , NPO status , Patient's Chart, lab work & pertinent test results  Airway Mallampati: II  TM Distance: >3 FB Neck ROM: Full    Dental  (+) Dental Advisory Given, Edentulous Upper, Edentulous Lower,    Pulmonary neg pulmonary ROS, Current Smoker,    Pulmonary exam normal breath sounds clear to auscultation       Cardiovascular hypertension,  Rhythm:Regular Rate:Normal  LVH   Neuro/Psych Bipolar Disorder Schizophrenia negative neurological ROS  negative psych ROS   GI/Hepatic negative GI ROS, Neg liver ROS, GERD  Medicated and Controlled,(+)     substance abuse  alcohol use, Alcoholic liver disease Chronic pancreatitis   Endo/Other  negative endocrine ROS  Renal/GU negative Renal ROS  negative genitourinary   Musculoskeletal   Abdominal   Peds  Hematology negative hematology ROS (+)   Anesthesia Other Findings   Reproductive/Obstetrics negative OB ROS                             Anesthesia Physical Anesthesia Plan  ASA: II  Anesthesia Plan: General   Post-op Pain Management:    Induction:   Airway Management Planned:   Additional Equipment:   Intra-op Plan:   Post-operative Plan:   Informed Consent: I have reviewed the patients History and Physical, chart, labs and discussed the procedure including the risks, benefits and alternatives for the proposed anesthesia with the patient or authorized representative who has indicated his/her understanding and acceptance.   Dental Advisory Given  Plan Discussed with: CRNA  Anesthesia Plan Comments:         Anesthesia Quick Evaluation

## 2015-09-26 NOTE — Progress Notes (Addendum)
rn did ciwa assessment on pt, pt downplayed ETOH use. But visitor said pt "drinks a lot". Will contact md botero to get order for ciwa protocol.   Last ETOH use 3/6  1200 noon

## 2015-09-26 NOTE — Progress Notes (Signed)
Patient arrived from PACU to 5C12  at current time. Patient alert and oriented X4 with no c/o pain. Morphine PCA pump and SCDs in place. Honeycomb dressing clean, dry and intact, hemovac drain in. Vital signs taken and charted. Oriented to room with bed alarm on. Report received from Danella Sensinghonda Hunt, Pacu RN.

## 2015-09-26 NOTE — Care Management Note (Signed)
Case Management Note  Patient Details  Name: Kirk BuccoGary W Harper MRN: [id number] Date of Birth: 05/22/1961  Subjective/Objective:                    Action/Plan: Patient was admitted for a L3-4 L4-5 L5-S1 Laminectomy/Foraminotomy.  Will follow for discharge needs pending PT/OT evals and physician orders.  Expected Discharge Date:                  Expected Discharge Plan:     In-House Referral:     Discharge planning Services     Post Acute Care Choice:    Choice offered to:     DME Arranged:    DME Agency:     HH Arranged:    HH Agency:     Status of Service:  In process, will continue to follow  Medicare Important Message Given:    Date Medicare IM Given:    Medicare IM give by:    Date Additional Medicare IM Given:    Additional Medicare Important Message give by:     If discussed at Long Length of Stay Meetings, dates discussed:    Additional Comments:  Anda KraftRobarge, Patrycja Mumpower C, RN 09/26/2015, 3:10 PM [phone]

## 2015-09-26 NOTE — Transfer of Care (Signed)
Immediate Anesthesia Transfer of Care Note  Patient: Kirk BuccoGary W Harper  Procedure(s) Performed: Procedure(s) with comments: L3-4 L4-5 L5-S1 Laminectomy/Foraminotomy (N/A) - L3-4 L4-5 L5-S1 Laminectomy/Foraminotomy  Patient Location: PACU  Anesthesia Type:General  Level of Consciousness: awake and alert   Airway & Oxygen Therapy: Patient Spontanous Breathing and Patient connected to nasal cannula oxygen  Post-op Assessment: Report given to RN and Post -op Vital signs reviewed and stable  Post vital signs: Reviewed and stable  Last Vitals:  Filed Vitals:   09/26/15 0715 09/26/15 1115  BP: 156/104 147/111  Pulse: 79 98  Temp: 36.5 C 36.9 C  Resp: 20 13    Complications: No apparent anesthesia complications

## 2015-09-27 ENCOUNTER — Encounter (HOSPITAL_COMMUNITY): Payer: Self-pay | Admitting: Neurosurgery

## 2015-09-27 MED ORDER — PANTOPRAZOLE SODIUM 40 MG PO TBEC
40.0000 mg | DELAYED_RELEASE_TABLET | Freq: Every day | ORAL | Status: DC
  Administered 2015-09-27 – 2015-09-28 (×2): 40 mg via ORAL
  Filled 2015-09-27 (×2): qty 1

## 2015-09-27 NOTE — Progress Notes (Signed)
Patient walked up and down the hall several times this morning with his brace and one assist. Pt tolerated it well and was motivated to move. Will continue to monitor. Amma Crear, [place] ScrapeSarah E, RN

## 2015-09-27 NOTE — Evaluation (Addendum)
Physical Therapy Evaluation Patient Details Name: Kirk Harper MRN: [id number] DOB: [birth year] Today's Date: 09/27/2015   History of Present Illness  Patient is a 55 y/o male admitted s/p L3-4, L4-5, L 5-S1 laminectomy, foraminotomy and posterior lateral arthrodesis.  PMH positive for bipolar, DDD, HTN, paranoia, GERD, pancreatitis, splenectomy and ETOH.  Clinical Impression  Patient presents with decreased mobility due to deficits listed in PT problem list below following above back procedure.  He will benefit from skilled PT in the acute setting to allow return home with family support.  Did not use walker this session, but noted gait difficulties and may benefit from having one as needed at home.      Follow Up Recommendations No PT follow up    Equipment Recommendations  Rolling walker with 5" wheels    Recommendations for Other Services       Precautions / Restrictions Precautions Precautions: Fall;Back Precaution Booklet Issued: Yes (comment) Required Braces or Orthoses: Spinal Brace Spinal Brace: Lumbar corset;Applied in sitting position Restrictions Weight Bearing Restrictions: No      Mobility  Bed Mobility Overal bed mobility: Needs Assistance Bed Mobility: Sidelying to Sit;Sit to Sidelying   Sidelying to sit: Supervision     Sit to sidelying: Supervision General bed mobility comments: cues for technique   Transfers Overall transfer level: Modified independent Equipment used: None                Ambulation/Gait Ambulation/Gait assistance: Min guard;Supervision Ambulation Distance (Feet): 200 Feet Assistive device: None;1 person hand held assist Gait Pattern/deviations: Step-through pattern;Antalgic;Trunk flexed;Wide base of support     General Gait Details: flexed at trunk/hips, flexes knees to allow for upright posture  Stairs Stairs: Yes Stairs assistance: Min guard Stair Management: Two rails;Step to pattern;Forwards Number of Stairs:  4 General stair comments: wife supporting on steps, cues for step to technique   Wheelchair Mobility    Modified Rankin (Stroke Patients Only)       Balance Overall balance assessment: Needs assistance         Standing balance support: No upper extremity supported Standing balance-Leahy Scale: Fair Standing balance comment: able to stand and walk at times without UE support, initially held wife's hand; has wide BOS and little cautious, but no LOB                             Pertinent Vitals/Pain Pain Assessment: 0-10 Pain Score: 4  Pain Location: back at incision Pain Descriptors / Indicators: Aching;Sore Pain Intervention(s): Monitored during session;Repositioned;PCA encouraged  On PCA and ambulated off O2 with O2 sats ranging 83-72 without noted signs of distress (question probe placement)    Home Living Family/patient expects to be discharged to:: Private residence Living Arrangements: Spouse/significant other Available Help at Discharge: Family Type of Home: House Home Access: Stairs to enter Entrance Stairs-Rails: Doctor, general practice of Steps: 4 Home Layout: One level Home Equipment: None      Prior Function Level of Independence: Independent         Comments: on disability with planned future CTS bilat and cervical surgery     Hand Dominance        Extremity/Trunk Assessment   Upper Extremity Assessment: Overall WFL for tasks assessed           Lower Extremity Assessment: Overall WFL for tasks assessed         Communication   Communication: No difficulties  Cognition Arousal/Alertness: Awake/alert  Behavior During Therapy: Anxious Overall Cognitive Status: Within Functional Limits for tasks assessed                      General Comments      Exercises        Assessment/Plan    PT Assessment Patient needs continued PT services  PT Diagnosis Difficulty walking;Acute pain   PT Problem List  Decreased activity tolerance;Decreased balance;Pain;Decreased safety awareness;Decreased knowledge of use of DME;Decreased knowledge of precautions  PT Treatment Interventions DME instruction;Gait training;Functional mobility training;Therapeutic activities;Patient/family education   PT Goals (Current goals can be found in the Care Plan section) Acute Rehab PT Goals Patient Stated Goal: To go home today PT Goal Formulation: With patient/family Time For Goal Achievement: 09/29/15 Potential to Achieve Goals: Good    Frequency Min 5X/week   Barriers to discharge        Co-evaluation               End of Session Equipment Utilized During Treatment: Back brace Activity Tolerance: Patient tolerated treatment well Patient left: in bed;with call bell/phone within reach;with family/visitor present           Time: [id number] PT Time Calculation (min) (ACUTE ONLY): 18 min   Charges:   PT Evaluation $PT Eval Moderate Complexity: 1 Procedure     PT G CodesElray Mcgregor:        Cadence Minton 09/27/2015, 10:37 AM  Sheran Lawlessyndi Stayce Delancy, PT [phone] 09/27/2015

## 2015-09-27 NOTE — Clinical Social Work Note (Signed)
CSW received referral for SNF.  Case discussed with case manager, and plan is to discharge home PT is not recommending any followup.  CSW to sign off please re-consult if social work needs arise.  Lorilyn Laitinen R. Minie Roadcap, MSW, LCSWA [phone]  

## 2015-09-27 NOTE — Op Note (Signed)
NAME:  Rolm BookbinderRATLIFF, Ashanti                ACCOUNT NO.:  [id number]  MEDICAL RECORD NO.:  [id number]  LOCATION:  [facility]                         FACILITY:  [facility]  PHYSICIAN:  Hilda LiasErnesto Nori Winegar, M.D.   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  PROCEDURE:  Bilateral L3, 4, 5 laminectomy.  Bilateral foraminotomies 3- 4, 4-5, and 5-1.  Posterolateral arthrodesis with autograft. Microscope.  SURGEON:  Hilda LiasErnesto Kenyon Eshleman, M.D.  ASSISTANT:  Kathaleen MaserHenry A. Pool, M.D.  CLINICAL HISTORY:  The patient had been seen by me in my office for many years complaining of back pain worsened to both legs.  It came to the point that he had difficulty walking.  He had weakness bilaterally in both feet.  X-ray shows stenosis from L3 down to L5-S1.  There was no evidence of spondylolisthesis.  Surgery was advised.  He and his family knew the risks and benefits.  DESCRIPTION OF PROCEDURE:  The patient was taken to the OR.  After intubation, he was positioned in a prone manner.  The back was cleaned with Betadine and later on with DuraPrep.  Drapes were applied.  Midline incision from L3 to L5-S1 was made and muscles were retracted all the way laterally until we were able to see the lateral aspect of the facet of 3, 4, and 5.  Then, with the Le[place], we removed the spinous process of 3, 4, 5 and using the drill with the help of the microscope, we did a bilateral laminectomy.  The patient had quite a bit of thick calcified ligament compromising the canal.  Removal of the ligament was done. Then, we went to the foramina of 3, 4, 5, bilaterally, and we had good decompression of the foramen.  From then on, we cleaned the bone and removed from the  posterior element.  We drilled the lateral aspect of the facet of 3-4, 4-5 and 5-1 and the autograft was used for arthrodesis.  The area was irrigated.  Valsalva maneuver was negative. From then on, the wound was closed with Vicryl and staples.  A large drain was left in the operative site.          ______________________________ Hilda LiasErnesto Deette Revak, M.D.     EB/MEDQ  D:  09/26/2015  T:  09/26/2015  Job:  [id number]

## 2015-09-27 NOTE — Progress Notes (Signed)
Patient ID: Kirk Harper, male   DOB: 07/11/1961, 55 y.o.   MRN: [id number] No pain as preop. No weakness.hemovac still draining  Dc in am?

## 2015-09-27 NOTE — Progress Notes (Signed)
Pt refused to walk in halls due to being in too much pain. RN explained the importance of ambulating.  Will continue to monitor.   Estanislado EmmsAshley Schwarz, RN

## 2015-09-27 NOTE — Evaluation (Signed)
Occupational Therapy Evaluation Patient Details Name: Kirk BuccoGary W Clyatt MRN: [id number] DOB: 11/02/1960 Today's Date: 09/27/2015    History of Present Illness Patient is a 55 y/o male admitted s/p L3-4, L4-5, L 5-S1 laminectomy, foraminotomy and posterior lateral arthrodesis.  PMH positive for bipolar, DDD, HTN, paranoia, GERD, pancreatitis, splenectomy and ETOH.   Clinical Impression   Pt reports he was independent with ADLs and mobility PTA. Currently pt overall supervision for safety with ADLs and functional mobility. Pt able to verbally recall 2/3 precautions at start of session; educated on all precautions. Pt planning to d/c home with 24/7 supervision from his wife. Pt would benefit from continued skilled OT to address established goals.    Follow Up Recommendations  No OT follow up;Supervision - Intermittent    Equipment Recommendations  None recommended by OT    Recommendations for Other Services       Precautions / Restrictions Precautions Precautions: Fall;Back Precaution Booklet Issued: Yes (comment) Precaution Comments: Pt able to recall 2/3 back precautions at start of session. Reviewed all precautions with pt and family. Required Braces or Orthoses: Spinal Brace Spinal Brace: Lumbar corset;Applied in sitting position Restrictions Weight Bearing Restrictions: No      Mobility Bed Mobility Overal bed mobility: Needs Assistance Bed Mobility: Sidelying to Sit;Sit to Sidelying   Sidelying to sit: Supervision     Sit to sidelying: Supervision General bed mobility comments: Pt sitting EOB upon arrival.  Transfers Overall transfer level: Modified independent Equipment used: None                  Balance Overall balance assessment: Needs assistance         Standing balance support: No upper extremity supported Standing balance-Leahy Scale: Fair Standing balance comment: with dynamic balance                            ADL Overall ADL's :  Needs assistance/impaired Eating/Feeding: Set up;Sitting   Grooming: Supervision/safety;Standing Grooming Details (indicate cue type and reason): Educated on use of cup for oral care. Upper Body Bathing: Set up;Sitting   Lower Body Bathing: Supervison/ safety;Sit to/from stand   Upper Body Dressing : Set up;Sitting Upper Body Dressing Details (indicate cue type and reason): Pt able to doff/don brace sitting EOB. Lower Body Dressing: Supervision/safety;Sit to/from stand Lower Body Dressing Details (indicate cue type and reason): Pt able to cross foot over opposite knee. Educated on compensatory strategies for LB ADLs; pt verbalized understanding. Toilet Transfer: Supervision/safety;Ambulation;Regular Toilet   Toileting- ArchitectClothing Manipulation and Hygiene: Supervision/safety;Sit to/from stand Toileting - Clothing Manipulation Details (indicate cue type and reason): Pt able to reach bottom for peri care without twisting. Tub/ Shower Transfer: Min guard;Ambulation;3 in 1 Tub/Shower Transfer Details (indicate cue type and reason): Simulated tub transfer technique. Educated on need for close supervision for safety and use of 3 in 1 in tub as a seat. Functional mobility during ADLs: Supervision/safety General ADL Comments: Pts wife and brother present for OT eval. Educated on home safety, placing frequently used items at counter top height, maintaining precautions during functional activities.     Vision Vision Assessment?: No apparent visual deficits   Perception     Praxis      Pertinent Vitals/Pain Pain Assessment: 0-10 Pain Score: 4  Pain Location: back Pain Descriptors / Indicators: Aching;Sore Pain Intervention(s): Limited activity within patient's tolerance;Monitored during session     Hand Dominance     Extremity/Trunk Assessment Upper Extremity  Assessment Upper Extremity Assessment: Overall WFL for tasks assessed   Lower Extremity Assessment Lower Extremity Assessment:  Defer to PT evaluation   Cervical / Trunk Assessment Cervical / Trunk Assessment: Other exceptions Cervical / Trunk Exceptions: s/p lumbar sx   Communication Communication Communication: No difficulties   Cognition Arousal/Alertness: Awake/alert Behavior During Therapy: WFL for tasks assessed/performed Overall Cognitive Status: Within Functional Limits for tasks assessed       Memory: Decreased recall of precautions             General Comments       Exercises       Shoulder Instructions      Home Living Family/patient expects to be discharged to:: Private residence Living Arrangements: Spouse/significant other Available Help at Discharge: Family;Available 24 hours/day Type of Home: House Home Access: Stairs to enter [employer] of Steps: 4 Entrance Stairs-Rails: Right;Left Home Layout: One level     Bathroom Shower/Tub: Tub/shower unit Shower/tub characteristics: Engineer, building services: Standard     Home Equipment: Bedside commode          Prior Functioning/Environment Level of Independence: Independent        Comments: on disability with planned future CTS bilat and cervical surgery    OT Diagnosis: Acute pain   OT Problem List: Impaired balance (sitting and/or standing);Decreased safety awareness;Decreased knowledge of use of DME or AE;Decreased knowledge of precautions;Pain   OT Treatment/Interventions: Self-care/ADL training;DME and/or AE instruction;Energy conservation;Patient/family education;Balance training    OT Goals(Current goals can be found in the care plan section) Acute Rehab OT Goals Patient Stated Goal: To go home today OT Goal Formulation: With patient Time For Goal Achievement: 10/11/15 Potential to Achieve Goals: Good ADL Goals Pt Will Perform Grooming: with modified independence;standing Pt Will Transfer to Toilet: with modified independence;ambulating;regular height toilet Pt Will Perform Toileting - Clothing  Manipulation and hygiene: with modified independence;sit to/from stand Pt Will Perform Tub/Shower Transfer: Tub transfer;with modified independence;ambulating;3 in 1 Additional ADL Goal #1: Pt will independently don/doff back brace for incresed independence/safety with ADLs and functional mobility. Additional ADL Goal #2: Pt will independently verbally recall 3/3 back precautions and maintain throughout ADL activity.  OT Frequency: Min 2X/week   Barriers to D/C:            Co-evaluation              End of Session Equipment Utilized During Treatment: Back brace;Oxygen  Activity Tolerance: Patient tolerated treatment well Patient left: with call bell/phone within reach;with family/visitor present (sitting EOB)   Time: 1610-9604 OT Time Calculation (min): 16 min Charges:  OT General Charges $OT Visit: 1 Procedure OT Evaluation $OT Eval Moderate Complexity: 1 Procedure G-Codes:     Gaye Alken M.S., OTR/L Pager: [phone]  09/27/2015, 12:07 PM

## 2015-09-28 NOTE — Care Management Note (Signed)
Case Management Note  Patient Details  Name: Kirk Harper MRN: [id number] Date of Birth: 09/09/1960  Subjective/Objective:                    Action/Plan: Patient discharging to home with self care and orders for a rolling walker. [facility] and she will deliver the equipment to the room. Will update the bedside RN.   Expected Discharge Date:                  Expected Discharge Plan:  Home/Self Care  In-House Referral:     Discharge planning Services  CM Consult  Post Acute Care Choice:  Durable Medical Equipment Choice offered to:  Patient  DME Arranged:  Walker rolling DME Agency:  [facility]  HH Arranged:    HH Agency:     Status of Service:  Completed, signed off  Medicare Important Message Given:    Date Medicare IM Given:    Medicare IM give by:    Date Additional Medicare IM Given:    Additional Medicare Important Message give by:     If discussed at Long Length of Stay Meetings, dates discussed:    Additional Comments:  Kermit BaloKelli F Macarius Ruark, RN 09/28/2015, 11:42 AM

## 2015-09-28 NOTE — Progress Notes (Signed)
Pt ambulated in halls with brace and walker without difficulty.  Will continue to monitor.,    Estanislado EmmsAshley Schwarz, RN

## 2015-09-28 NOTE — Progress Notes (Signed)
Physical Therapy Treatment Patient Details Name: Kirk Harper MRN: [id number] DOB: 1961/01/29 Today's Date: 09/28/2015    History of Present Illness Patient is a 55 y/o male admitted s/p L3-4, L4-5, L 5-S1 laminectomy, foraminotomy and posterior lateral arthrodesis.  PMH positive for bipolar, DDD, HTN, paranoia, GERD, pancreatitis, splenectomy and ETOH.    PT Comments    Patient with unsteady gait without RW due to bil flexed knees/kyphosis. More upright and steady with RW. Wife present for education with safe use of RW and back precautions.   Follow Up Recommendations  No PT follow up;Supervision for mobility/OOB     Equipment Recommendations  Rolling walker with 5" wheels    Recommendations for Other Services       Precautions / Restrictions Precautions Precautions: Fall;Back Precaution Comments: Stated 3/3 however required vc multiple times to prevent twisting Required Braces or Orthoses: Spinal Brace Spinal Brace: Lumbar corset;Applied in sitting position Restrictions Weight Bearing Restrictions: No    Mobility  Bed Mobility               General bed mobility comments: Pt sitting EOB upon arrival.  Transfers Overall transfer level: Needs assistance Equipment used: Rolling walker (2 wheeled) Transfers: Sit to/from Stand Sit to Stand: Supervision         General transfer comment: vc for safe use of RW  Ambulation/Gait Ambulation/Gait assistance: Min guard Ambulation Distance (Feet): 60 Feet Assistive device: Rolling walker (2 wheeled) Gait Pattern/deviations: Step-through pattern;Decreased stride length (bil knee flexion; decr lumbar lordosis)   Gait velocity interpretation: Below normal speed for age/gender General Gait Details: initially lifting RW to advance; progressed to step-to and ultimately step-thru gait pattern   Stairs Stairs:  (pt/wife deferred)          Wheelchair Mobility    Modified Rankin (Stroke Patients Only)        Balance Overall balance assessment: Needs assistance Sitting-balance support: Feet supported;No upper extremity supported Sitting balance-Leahy Scale: Good     Standing balance support: Bilateral upper extremity supported;During functional activity Standing balance-Leahy Scale: Fair                      Cognition Arousal/Alertness: Awake/alert Behavior During Therapy: WFL for tasks assessed/performed Overall Cognitive Status: Within Functional Limits for tasks assessed                      Exercises      General Comments General comments (skin integrity, edema, etc.): Eager to d/c home      Pertinent Vitals/Pain Pain Assessment: 0-10 Pain Score: 4  Pain Location: back Pain Descriptors / Indicators: Operative site guarding Pain Intervention(s): Limited activity within patient's tolerance;Monitored during session;Premedicated before session;Repositioned    Home Living                      Prior Function            PT Goals (current goals can now be found in the care plan section) Acute Rehab PT Goals Patient Stated Goal: To go home today Time For Goal Achievement: 09/29/15 Progress towards PT goals: Progressing toward goals    Frequency       PT Plan Current plan remains appropriate    Co-evaluation             End of Session Equipment Utilized During Treatment: Back brace Activity Tolerance: Patient tolerated treatment well Patient left: with call bell/phone within reach;with family/visitor present (sitting  EOB waiting for RN to d/c IV for d/c home)     Time: [id number] PT Time Calculation (min) (ACUTE ONLY): 17 min  Charges:  $Gait Training: 8-22 mins                    G Codes:      Elverna Caffee 09/28/2015, 11:53 AM Pager [phone]

## 2015-09-28 NOTE — Discharge Summary (Signed)
Physician Discharge Summary  Patient ID: Kirk BuccoGary W Harper MRN: [id number] DOB/AGE: 57/10/1960 57 y.o.  Admit date: 09/26/2015 Discharge date: 09/28/2015  Admission Diagnoses:lumbar stenosis  Discharge Diagnoses:  Active Problems:   Lumbar stenosis with neurogenic claudication   Discharged Condition: no pain  Hospital Course: surgery  Consults: none  Significant Diagnostic Studies: myelogram  Treatments: lumbar decompression  Discharge Exam: Blood pressure 116/83, pulse 88, temperature 98.5 F (36.9 C), temperature source Oral, resp. rate 7, height 6\' 1"  (1.854 m), weight 105.688 kg (233 lb), SpO2 92 %. No weakness  Disposition: home     Medication List    ASK your doctor about these medications        naproxen 500 MG tablet  Commonly known as:  NAPROSYN  Take 1 tablet (500 mg total) by mouth 2 (two) times daily.     oxyCODONE-acetaminophen 10-325 MG tablet  Commonly known as:  PERCOCET  Take 1-2 tablets by mouth every 4 (four) hours as needed for pain.     pantoprazole 40 MG tablet  Commonly known as:  PROTONIX  Take 40 mg by mouth daily.     ZITHROMAX Z-PAK 250 MG tablet  Generic drug:  azithromycin  Take 250 mg by mouth daily.         Signed: Karn CassisBOTERO,Myra Weng M 09/28/2015, 9:22 AM

## 2015-09-28 NOTE — Progress Notes (Signed)
Occupational Therapy Treatment Patient Details Name: Kirk BuccoGary W Riesen MRN: [id number] DOB: 08/18/1960 Today's Date: 09/28/2015    History of present illness Patient is a 55 y/o male admitted s/p L3-4, L4-5, L 5-S1 laminectomy, foraminotomy and posterior lateral arthrodesis.  PMH positive for bipolar, DDD, HTN, paranoia, GERD, pancreatitis, splenectomy and ETOH.   OT comments  Pt making progress toward OT goals. Pt able to don shirt and brace sitting EOB with set up. Educated pt on tub transfer technique with use of RW and 3 in 1; pt able to return demo with min guard assist from wife. D/c plan remains appropriate at this time. Will continue to follow acutely.   Follow Up Recommendations  No OT follow up;Supervision - Intermittent    Equipment Recommendations  None recommended by OT    Recommendations for Other Services      Precautions / Restrictions Precautions Precautions: Fall;Back Precaution Comments: Pt able to recall 3/3 back precautions at start of session Required Braces or Orthoses: Spinal Brace Spinal Brace: Lumbar corset;Applied in sitting position Restrictions Weight Bearing Restrictions: No       Mobility Bed Mobility               General bed mobility comments: Pt sitting EOB upon arrival.  Transfers Overall transfer level: Needs assistance Equipment used: Rolling walker (2 wheeled) Transfers: Sit to/from Stand Sit to Stand: Supervision              Balance Overall balance assessment: Needs assistance Sitting-balance support: Feet supported;No upper extremity supported Sitting balance-Leahy Scale: Good     Standing balance support: Bilateral upper extremity supported;During functional activity Standing balance-Leahy Scale: Fair                     ADL Overall ADL's : Needs assistance/impaired                 Upper Body Dressing : Set up;Sitting Upper Body Dressing Details (indicate cue type and reason): To don shirt and back  brace             Tub/ Shower Transfer: Min guard;Ambulation;3 in 1;Rolling walker Tub/Shower Transfer Details (indicate cue type and reason): Educated pt on tub transfer with use of RW. Wife present and verbalized understanding of how she can assist. Functional mobility during ADLs: Supervision/safety;Rolling walker General ADL Comments: Pts wife present for OT session. Reviewed back education and home safety.      Vision                     Perception     Praxis      Cognition   Behavior During Therapy: WFL for tasks assessed/performed Overall Cognitive Status: Within Functional Limits for tasks assessed                       Extremity/Trunk Assessment               Exercises     Shoulder Instructions       General Comments      Pertinent Vitals/ Pain       Pain Assessment: 0-10 Pain Score: 6  Pain Location: back Pain Descriptors / Indicators: Aching;Sore Pain Intervention(s): Limited activity within patient's tolerance;Monitored during session;Repositioned  Home Living  Prior Functioning/Environment              Frequency Min 2X/week     Progress Toward Goals  OT Goals(current goals can now be found in the care plan section)  Progress towards OT goals: Progressing toward goals  Acute Rehab OT Goals Patient Stated Goal: To go home today OT Goal Formulation: With patient  Plan Discharge plan remains appropriate    Co-evaluation                 End of Session Equipment Utilized During Treatment: Back brace;Rolling walker;Gait belt   Activity Tolerance Patient tolerated treatment well   Patient Left with call bell/phone within reach;with family/visitor present (sitting EOB)   Nurse Communication          Time: 1042-1100 OT Time Calculation (min): 18 min  Charges: OT General Charges $OT Visit: 1 Procedure OT Treatments $Self Care/Home Management :  8-22 mins  Gaye Alken M.S., OTR/L Pager: [phone]  09/28/2015, 11:09 AM

## 2015-09-28 NOTE — Progress Notes (Signed)
Patient is being d/c. D/c instructions given and patient verbalized understanding. Patient awaiting walker at this time.

## 2015-10-31 ENCOUNTER — Encounter (HOSPITAL_COMMUNITY): Payer: Self-pay | Admitting: Emergency Medicine

## 2015-10-31 ENCOUNTER — Emergency Department (HOSPITAL_COMMUNITY): Payer: Medicaid Other

## 2015-10-31 ENCOUNTER — Emergency Department (HOSPITAL_COMMUNITY)
Admission: EM | Admit: 2015-10-31 | Discharge: 2015-10-31 | Disposition: A | Discharge status: Home / Self Care | Payer: Medicaid Other | Attending: Emergency Medicine | Admitting: Emergency Medicine

## 2015-10-31 DIAGNOSIS — F319 Bipolar disorder, unspecified: Secondary | ICD-10-CM | POA: Diagnosis not present

## 2015-10-31 DIAGNOSIS — F17210 Nicotine dependence, cigarettes, uncomplicated: Secondary | ICD-10-CM | POA: Diagnosis not present

## 2015-10-31 DIAGNOSIS — M5417 Radiculopathy, lumbosacral region: Secondary | ICD-10-CM | POA: Insufficient documentation

## 2015-10-31 DIAGNOSIS — Z79891 Long term (current) use of opiate analgesic: Secondary | ICD-10-CM | POA: Diagnosis not present

## 2015-10-31 DIAGNOSIS — I10 Essential (primary) hypertension: Secondary | ICD-10-CM | POA: Diagnosis not present

## 2015-10-31 DIAGNOSIS — M79604 Pain in right leg: Secondary | ICD-10-CM | POA: Diagnosis present

## 2015-10-31 DIAGNOSIS — Z79899 Other long term (current) drug therapy: Secondary | ICD-10-CM | POA: Diagnosis not present

## 2015-10-31 MED ORDER — GABAPENTIN 100 MG PO CAPS: 100.0000 mg | ORAL_CAPSULE | Freq: Three times a day (TID) | ORAL | Status: DC

## 2015-10-31 MED ORDER — GABAPENTIN 100 MG PO CAPS
100.0000 mg | ORAL_CAPSULE | Freq: Once | ORAL | Status: AC
  Administered 2015-10-31: 100 mg via ORAL
  Filled 2015-10-31: qty 1

## 2015-10-31 NOTE — ED Notes (Signed)
[place], rates pain 8/10.

## 2015-10-31 NOTE — ED Notes (Signed)
Pt refusing discharge vitals - upset at wait time.

## 2015-10-31 NOTE — ED Notes (Signed)
Pt in hallway, upset that xray is taking so long to be read - pt updated that radiology experienced a delay; however the radiologist will read it as soon as possible and the EDP will provide disposition as soon as possible.

## 2015-10-31 NOTE — ED Notes (Signed)
PT smells strongly of alcohol and when questioned he stated he has been drinking beer today.

## 2015-10-31 NOTE — Discharge Instructions (Signed)
Lumbosacral Radiculopathy °Lumbosacral radiculopathy is a condition that involves the spinal nerves and nerve roots in the low back and bottom of the spine. The condition develops when these nerves and nerve roots move out of place or become inflamed and cause symptoms. °CAUSES °This condition may be caused by: °· Pressure from a disk that bulges out of place (herniated disk). A disk is a plate of cartilage that separates bones in the spine. °· Disk degeneration. °· A narrowing of the bones of the lower back (spinal stenosis). °· A tumor. °· An infection. °· An injury that places sudden pressure on the disks that cushion the bones of your lower spine. °RISK FACTORS °This condition is more likely to develop in: °· Males aged 30-50 years. °· Females aged 50-60 years. °· People who lift improperly. °· People who are overweight or live a sedentary lifestyle. °· People who smoke. °· People who perform repetitive activities that strain the spine. °SYMPTOMS °Symptoms of this condition include: °· Pain that goes down from the back into the legs (sciatica). This is the most common symptom. The pain may be worse with sitting, coughing, or sneezing. °· Pain and numbness in the arms and legs. °· Muscle weakness. °· Tingling. °· Loss of bladder control or bowel control. °DIAGNOSIS °This condition is diagnosed with a physical exam and medical history. If the pain is lasting, you may have tests, such as: °· MRI scan. °· X-ray. °· CT scan. °· Myelogram. °· Nerve conduction study. °TREATMENT °This condition is often treated with: °· Hot packs and ice applied to affected areas. °· Stretches to improve flexibility. °· Exercises to strengthen back muscles. °· Physical therapy. °· Pain medicine. °· A steroid injection in the spine. °In some cases, no treatment is needed. If the condition is long-lasting (chronic), or if symptoms are severe, treatment may involve surgery or lifestyle changes, such as following a weight loss plan. °HOME  CARE INSTRUCTIONS °Medicines °· Take medicines only as directed by your health care provider. °· Do not drive or operate heavy machinery while taking pain medicine. °Injury Care °· Apply a heat pack to the injured area as directed by your health care provider. °· Apply ice to the affected area: °¨ Put ice in a plastic bag. °¨ Place a towel between your skin and the bag. °¨ Leave the ice on for 20-30 minutes, every 2 hours while you are awake or as needed. Or, leave the ice on for as long as directed by your health care provider. °Other Instructions °· If you were shown how to do any exercises or stretches, do them as directed by your health care provider. °· If your health care provider prescribed a diet or exercise program, follow it as directed. °· Keep all follow-up visits as directed by your health care provider. This is important. °SEEK MEDICAL CARE IF: °· Your pain does not improve over time even when taking pain medicines. °SEEK IMMEDIATE MEDICAL CARE IF: °· Your develop severe pain. °· Your pain suddenly gets worse. °· You develop increasing weakness in your legs. °· You lose the ability to control your bladder or bowel. °· You have difficulty walking or balancing. °· You have a fever. °  °This information is not intended to replace advice given to you by your health care provider. Make sure you discuss any questions you have with your health care provider. °  °Document Released: 07/08/2005 Document Revised: 11/22/2014 Document Reviewed: 07/04/2014 °Elsevier Interactive Patient Education ©2016 [employer] ° °

## 2015-10-31 NOTE — ED Provider Notes (Signed)
CSN: [id number]     Arrival date & time 10/31/15  1537 History   First MD Initiated Contact with Patient 10/31/15 1631     Chief Complaint  Patient presents with  . Leg Pain     (Consider location/radiation/quality/duration/timing/severity/associated sxs/prior Treatment) HPI Patient underwent L3-4, L4-5, L 5-S1 laminectomy, foraminotomy and posterior lateral arthrodesis on 09/26/15 by Dr. Jeral FruitBotero. States that he was walking a week ago and fell landing on his right side. No immediate pain. The last 3-4 days he has experienced increased burning sensation to the bottoms of his feet right greater than left. His chronic narcotics for his ongoing pain but that this does not help the burning sensation to his feet. He has had no weakness. No bladder or bowel incontinence. No fever or chills. Patient has a history of chronic alcoholism and admits to drinking prior to coming to the emergency department.  Past Medical History  Diagnosis Date  . Bipolar affective (HCC)   . Hypertension   . DDD (degenerative disc disease), lumbar   . Back pain   . Paranoia (HCC)   . Pancreatitis   . GERD (gastroesophageal reflux disease)     takes tums prn  . Alcohol abuse    Past Surgical History  Procedure Laterality Date  . Spleen  spleen removed  . Splenectomy    . Multiple extractions with alveoloplasty N/A 06/28/2013    Procedure: MULTIPLE EXTRACTION WITH ALVEOLOPLASTY AND TORI ;  Surgeon: Georgia LopesScott M Jensen, DDS;  Location: [facility] OR;  Service: Oral Surgery;  Laterality: N/A;  . Lumbar laminectomy/decompression microdiscectomy N/A 09/26/2015    Procedure: L3-4 L4-5 L5-S1 Laminectomy/Foraminotomy;  Surgeon: Hilda LiasErnesto Botero, MD;  Location: [facility];  Service: Neurosurgery;  Laterality: N/A;  L3-4 L4-5 L5-S1 Laminectomy/Foraminotomy   History reviewed. No pertinent family history. Social History  Substance Use Topics  . Smoking status: Current Every Day Smoker -- 0.50 packs/day for 40 years    Types: Cigarettes  .  Smokeless tobacco: None  . Alcohol Use: 36.0 oz/week    48 Standard drinks or equivalent, 12 Cans of beer per week     Comment: occasionally    Review of Systems  Constitutional: Negative for fever and chills.  Genitourinary: Negative for difficulty urinating.  Musculoskeletal: Positive for back pain. Negative for myalgias, arthralgias and neck pain.  Skin: Negative for rash and wound.  Neurological: Negative for weakness and numbness.  All other systems reviewed and are negative.     Allergies  Review of patient's allergies indicates no known allergies.  Home Medications   Prior to Admission medications   Medication Sig Start Date End Date Taking? Authorizing Provider  oxyCODONE-acetaminophen (PERCOCET) 10-325 MG per tablet Take 1-2 tablets by mouth every 4 (four) hours as needed for pain. 06/28/13  Yes Ocie DoyneScott Jensen, DDS  pantoprazole (PROTONIX) 40 MG tablet Take 40 mg by mouth daily.   Yes Historical Provider, MD  gabapentin (NEURONTIN) 100 MG capsule Take 1 capsule (100 mg total) by mouth 3 (three) times daily. 10/31/15   Loren Raceravid Yosiel Thieme, MD   BP 155/115 mmHg  Pulse 117  Temp(Src) 97.8 F (36.6 C) (Oral)  Resp 18  Ht 5\' 11"  (1.803 m)  Wt 233 lb (105.688 kg)  BMI 32.51 kg/m2  SpO2 99% Physical Exam  Constitutional: He is oriented to person, place, and time. He appears well-developed and well-nourished. No distress.  HENT:  Head: Normocephalic and atraumatic.  Mouth/Throat: Oropharynx is clear and moist.  Eyes: EOM are normal. Pupils are  equal, round, and reactive to light.  Neck: Normal range of motion. Neck supple.  Cardiovascular: Normal rate and regular rhythm.   Pulmonary/Chest: Effort normal and breath sounds normal. No respiratory distress. He has no wheezes. He has no rales.  Abdominal: Soft. Bowel sounds are normal. He exhibits no distension and no mass. There is no tenderness. There is no rebound and no guarding.  Musculoskeletal: Normal range of motion. He  exhibits tenderness. He exhibits no edema.  Patient with surgical scar to the midline lumbar region. There is a small amount of swelling at the site. There is no warmth, erythema. Mild tenderness to palpation. No obvious deformity or injury. Patient has bilateral negative straight leg raises. He has 2+ dorsalis pedis and posterior tibial pulses bilaterally. Full range of motion of bilateral ankles, knees and hips.  Neurological: He is alert and oriented to person, place, and time.  Burning sensation to the plantar surface of both feet. 5/5 motor in all extremities. Ambulates without assistance.  Skin: Skin is warm and dry. No rash noted. No erythema.  Psychiatric: He has a normal mood and affect. His behavior is normal.  Nursing note and vitals reviewed.   ED Course  Procedures (including critical care time) Labs Review Labs Reviewed - No data to display  Imaging Review Dg Lumbar Spine Complete  10/31/2015  CLINICAL DATA:  Fall 1 week ago with generalized low back pain, initial encounter EXAM: LUMBAR SPINE - COMPLETE 4+ VIEW COMPARISON:  None. FINDINGS: Mild osteophytic changes are noted. No definitive compression deformity is seen. Disc space narrowing is noted at the lumbosacral junction. No acute abnormality is seen. Changes of prior laminectomy are again noted consistent with the given clinical history. IMPRESSION: Postsurgical and degenerative changes without acute abnormality. Electronically Signed   By: Alcide Clever M.D.   On: 10/31/2015 19:15   I have personally reviewed and evaluated these images and lab results as part of my medical decision-making.   EKG Interpretation None      MDM   Final diagnoses:  Lumbosacral radiculopathy    Patient presents with neuropathic pain to the posterior surface of both feet. Recent lumbar surgery. No evidence of postsurgical abscess or cauda equina syndrome. We'll get plain x-rays to assess for any acute bony abnormalities. We'll give dose of  gabapentin.  No abnormality on x-ray. Patient understands need to follow-up with his neurologist. He's been given return precautions and is voiced understanding.  Loren Racer, MD 10/31/15 1929

## 2016-02-06 ENCOUNTER — Emergency Department (HOSPITAL_COMMUNITY)
Admission: EM | Admit: 2016-02-06 | Discharge: 2016-02-06 | Disposition: A | Discharge status: Home / Self Care | Payer: Medicaid Other | Attending: Emergency Medicine | Admitting: Emergency Medicine

## 2016-02-06 ENCOUNTER — Encounter (HOSPITAL_COMMUNITY): Payer: Self-pay

## 2016-02-06 ENCOUNTER — Emergency Department (HOSPITAL_COMMUNITY): Payer: Medicaid Other

## 2016-02-06 DIAGNOSIS — E876 Hypokalemia: Secondary | ICD-10-CM | POA: Diagnosis not present

## 2016-02-06 DIAGNOSIS — R51 Headache: Secondary | ICD-10-CM | POA: Diagnosis not present

## 2016-02-06 DIAGNOSIS — R1110 Vomiting, unspecified: Secondary | ICD-10-CM | POA: Diagnosis not present

## 2016-02-06 DIAGNOSIS — Z79899 Other long term (current) drug therapy: Secondary | ICD-10-CM | POA: Diagnosis not present

## 2016-02-06 DIAGNOSIS — F17210 Nicotine dependence, cigarettes, uncomplicated: Secondary | ICD-10-CM | POA: Insufficient documentation

## 2016-02-06 DIAGNOSIS — R531 Weakness: Secondary | ICD-10-CM

## 2016-02-06 DIAGNOSIS — F319 Bipolar disorder, unspecified: Secondary | ICD-10-CM | POA: Insufficient documentation

## 2016-02-06 DIAGNOSIS — I10 Essential (primary) hypertension: Secondary | ICD-10-CM | POA: Insufficient documentation

## 2016-02-06 DIAGNOSIS — Z7982 Long term (current) use of aspirin: Secondary | ICD-10-CM | POA: Diagnosis not present

## 2016-02-06 LAB — COMPREHENSIVE METABOLIC PANEL
ALBUMIN: 4.3 g/dL (ref 3.5–5.0)
ALK PHOS: 111 U/L (ref 38–126)
ALT: 41 U/L (ref 17–63)
ANION GAP: 8 (ref 5–15)
AST: 78 U/L — ABNORMAL HIGH (ref 15–41)
BUN: 8 mg/dL (ref 6–20)
CALCIUM: 8.8 mg/dL — AB (ref 8.9–10.3)
CHLORIDE: 104 mmol/L (ref 101–111)
CO2: 20 mmol/L — AB (ref 22–32)
CREATININE: 0.76 mg/dL (ref 0.61–1.24)
GFR calc Af Amer: >60 mL/min (ref >60)
GFR calc non Af Amer: >60 mL/min (ref >60)
GLUCOSE: 132 mg/dL — AB (ref 65–99)
Potassium: 3.1 mmol/L — ABNORMAL LOW (ref 3.5–5.1)
SODIUM: 132 mmol/L — AB (ref 135–145)
Total Bilirubin: 1.2 mg/dL (ref 0.3–1.2)
Total Protein: 7.9 g/dL (ref 6.5–8.1)

## 2016-02-06 LAB — CBC WITH DIFFERENTIAL/PLATELET
BASOS ABS: 0 10*3/uL (ref 0.0–0.1)
BASOS PCT: 0 %
EOS ABS: 0.3 10*3/uL (ref 0.0–0.7)
Eosinophils Relative: 3 %
HCT: 42.7 % (ref 39.0–52.0)
HEMOGLOBIN: 15.3 g/dL (ref 13.0–17.0)
Lymphocytes Relative: 48 %
Lymphs Abs: 4.9 10*3/uL — ABNORMAL HIGH (ref 0.7–4.0)
MCH: 34.9 pg — ABNORMAL HIGH (ref 26.0–34.0)
MCHC: 35.8 g/dL (ref 30.0–36.0)
MCV: 97.5 fL (ref 78.0–100.0)
Monocytes Absolute: 0.9 10*3/uL (ref 0.1–1.0)
Monocytes Relative: 9 %
NEUTROS PCT: 40 %
Neutro Abs: 4 10*3/uL (ref 1.7–7.7)
PLATELETS: 167 10*3/uL (ref 150–400)
RBC: 4.38 MIL/uL (ref 4.22–5.81)
RDW: 15 % (ref 11.5–15.5)
WBC: 10.2 10*3/uL (ref 4.0–10.5)

## 2016-02-06 MED ORDER — POTASSIUM CHLORIDE ER 20 MEQ PO TBCR: 20.0000 meq | EXTENDED_RELEASE_TABLET | Freq: Two times a day (BID) | ORAL | Status: DC

## 2016-02-06 MED ORDER — POTASSIUM CHLORIDE CRYS ER 20 MEQ PO TBCR
20.0000 meq | EXTENDED_RELEASE_TABLET | Freq: Once | ORAL | Status: AC
  Administered 2016-02-06: 20 meq via ORAL
  Filled 2016-02-06: qty 1

## 2016-02-06 NOTE — ED Notes (Signed)
2 weeks of headaches, emesis each am x 1 (clear thick bubbles) " maybe from smoking ". Sunday pt took B/P half and dizzy was 95/63. " I just dont feel right " 28 ticks removed from himself 2 weeks ago

## 2016-02-06 NOTE — ED Provider Notes (Signed)
CSN: [id number]     Arrival date & time 02/06/16  1546 History   First MD Initiated Contact with Patient 02/06/16 1629     Chief Complaint  Patient presents with  . Weakness   HPI   55 year old male presents today with complaints of weakness. Patient reports that approximately 4 weeks ago he had a tick in his groin and he removed. He notes that 2 weeks after that approximately 2 weeks from today he was outside and was found to have 28 text after that. Patient reports that he works outside, with profuse sweating. He notes that since his take bite he has had fatigue that has persisted, he reports that he took all the ticks off and has red marks where all the ticks were. Patient denies any development of rash, worsening of redness, fever, chest pain, shortness of breath, lower extremity swelling or edema, neurological deficits, abdominal pain, palpitations, or any other concerning signs or symptoms. Patient denies any history of heart failure. She reports that for the last 3 weeks after getting a new before meals unit in his home he and his wife both wake up with throbbing headaches, productive cough that induces an episode of vomiting. Patient reports headaches resolve on their own after several hours, he denies any shows a chest pain, shortness of breath with a cough, no productive cough throughout the day.     Past Medical History  Diagnosis Date  . Bipolar affective (HCC)   . Hypertension   . DDD (degenerative disc disease), lumbar   . Back pain   . Paranoia (HCC)   . Pancreatitis   . GERD (gastroesophageal reflux disease)     takes tums prn  . Alcohol abuse    Past Surgical History  Procedure Laterality Date  . Spleen  spleen removed  . Splenectomy    . Multiple extractions with alveoloplasty N/A 06/28/2013    Procedure: MULTIPLE EXTRACTION WITH ALVEOLOPLASTY AND TORI ;  Surgeon: Georgia Lopes, DDS;  Location: [facility] OR;  Service: Oral Surgery;  Laterality: N/A;  . Lumbar  laminectomy/decompression microdiscectomy N/A 09/26/2015    Procedure: L3-4 L4-5 L5-S1 Laminectomy/Foraminotomy;  Surgeon: Hilda Lias, MD;  Location: [facility];  Service: Neurosurgery;  Laterality: N/A;  L3-4 L4-5 L5-S1 Laminectomy/Foraminotomy   History reviewed. No pertinent family history. Social History  Substance Use Topics  . Smoking status: Current Every Day Smoker -- 0.50 packs/day for 40 years    Types: Cigarettes  . Smokeless tobacco: None  . Alcohol Use: 36.0 oz/week    48 Standard drinks or equivalent, 12 Cans of beer per week     Comment: occasionally    Review of Systems  All other systems reviewed and are negative.   Allergies  Review of patient's allergies indicates no known allergies.  Home Medications   Prior to Admission medications   Medication Sig Start Date End Date Taking? Authorizing Provider  aspirin-acetaminophen-caffeine (EXCEDRIN EXTRA STRENGTH) [phone] MG tablet Take 2 tablets by mouth daily as needed for headache.   Yes Historical Provider, MD  oxyCODONE-acetaminophen (PERCOCET) 10-325 MG per tablet Take 1-2 tablets by mouth every 4 (four) hours as needed for pain. 06/28/13  Yes Ocie Doyne, DDS  pantoprazole (PROTONIX) 40 MG tablet Take 40 mg by mouth daily.   Yes Historical Provider, MD  gabapentin (NEURONTIN) 100 MG capsule Take 1 capsule (100 mg total) by mouth 3 (three) times daily. Patient not taking: Reported on 02/06/2016 10/31/15   Loren Racer, MD  potassium chloride  20 MEQ TBCR Take 20 mEq by mouth 2 (two) times daily. 02/06/16   Eyvonne MechanicJeffrey Hosea [place], PA-C   BP 122/89 mmHg  Pulse 118  Temp(Src) 97.8 F (36.6 C) (Oral)  Resp 20  Ht 6\' 2"  (1.88 m)  Wt 82.555 kg  BMI 23.36 kg/m2  SpO2 98%   Physical Exam  Constitutional: He is oriented to person, place, and time. He appears well-developed and well-nourished.  HENT:  Head: Normocephalic and atraumatic.  Eyes: Conjunctivae are normal. Pupils are equal, round, and reactive to light.  Right eye exhibits no discharge. Left eye exhibits no discharge. No scleral icterus.  Neck: Normal range of motion. No JVD present. No tracheal deviation present.  Cardiovascular: Normal rate, regular rhythm, normal heart sounds and intact distal pulses.  Exam reveals no gallop and no friction rub.   No murmur heard. Pulmonary/Chest: Effort normal and breath sounds normal. No stridor. No respiratory distress. He has no wheezes. He has no rales. He exhibits no tenderness.  Abdominal: Soft. He exhibits no distension. There is no tenderness. There is no rebound.  Musculoskeletal: Normal range of motion. He exhibits no edema or tenderness.  No lower extremity swelling or edema  Neurological: He is alert and oriented to person, place, and time. He has normal strength. A sensory deficit is present. No cranial nerve deficit. Coordination normal. GCS eye subscore is 4. GCS verbal subscore is 5. GCS motor subscore is 6.  Skin: Skin is warm and dry. No rash noted.  Numerous tick bites that appear to be healing throughout lower extremity and scattered throughout remainder of body, no signs of secondary infection. No pedal edema, no lower extremity swelling or edema.   Psychiatric: He has a normal mood and affect. His behavior is normal. Judgment and thought content normal.  Nursing note and vitals reviewed.    ED Course  Procedures (including critical care time) Labs Review Labs Reviewed  CBC WITH DIFFERENTIAL/PLATELET - Abnormal; Notable for the following:    MCH 34.9 (*)    Lymphs Abs 4.9 (*)    All other components within normal limits  COMPREHENSIVE METABOLIC PANEL - Abnormal; Notable for the following:    Sodium 132 (*)    Potassium 3.1 (*)    CO2 20 (*)    Glucose, Bld 132 (*)    Calcium 8.8 (*)    AST 78 (*)    All other components within normal limits  ROCKY MTN SPOTTED FVR ABS PNL(IGG+IGM)  B. BURGDORFI ANTIBODIES    Imaging Review Dg Chest 2 View  02/06/2016  CLINICAL DATA:   Smoker, multiple tick bites 2 weeks ago EXAM: CHEST  2 VIEW COMPARISON:  06/28/2013 FINDINGS: Cardiomediastinal silhouette is stable. No infiltrate or pleural effusion. No pulmonary edema. Mild degenerative changes thoracic spine. IMPRESSION: No active cardiopulmonary disease. Electronically Signed   By: Natasha MeadLiviu  Pop M.D.   On: 02/06/2016 17:02   I have personally reviewed and evaluated these images and lab results as part of my medical decision-making.   EKG Interpretation None      MDM   Final diagnoses:  Weakness  Hypokalemia    Labs: CBC, CMP- potassium 3.1, sodium 132  Imaging: DG chest 2 view  Consults:  Therapeutics: Potassium  Discharge Meds: Potassium  Assessment/Plan:  Patient presents with complaints of fatigue and tick exposure. Patient does not display fever, rash, neurological complaints, abdominal pain or chest pain. Patient is concern for tick borne illness, although I find this less likely in this patient he  does have numerous areas on his lower extremity from the tick bites that he reports are from them and not rash. They do not appear to have any petechial pattern, low suspicion for [facility] spotted fever or Lyme disease, I will draw labs at patient's request due to significant exposure and fatigue. Patient has no fever, he has no chest pain, productive cough presently, lower extremity swelling or edema, no signs of heart failure, respiratory infectious etiology, or cardiovascular related fatigue. Patient was noted to have hyponatremia, hypokalemia. This is likely due to his job outside with excessive sweating, decreased by mouth intake. Patient has no severe signs of hypokalemia here in the ED. Tolerating by mouth. Patient will be instructed to increase fluids, potassium at home, he will be discharged home with oral potassium. Patient will need to follow-up with his primary care provider 3 days for reevaluation and retested his sodium and potassium. Patient is given  strict return precautions, he verbalized understanding and agreement to today's plan and had no further questions or concerns at time of discharge.         Eyvonne Mechanic, PA-C 02/06/16 1755  Eber Hong, MD 02/07/16 [phone]

## 2016-02-06 NOTE — Discharge Instructions (Signed)
You presented today with complaints of fatigue. You were found to have low levels of potassium and sodium in your blood. These are electrolytes that can be replaced by maintaining adequate diet with the addition of oral supplement. Please take medication as directed, please follow-up with your primary care provider in 3 days for re-evaluation and re-check of your potassium and sodium. Please inform him of today's visit and all relevant data and need to follow-up with lab tests that were done here today. If you develop any fever, nausea or vomiting, abdominal pain, neurological complaints , chest pain, rash, or any other concerning signs or symptoms please return immediately to the emergency room for further evaluation.   Hypokalemia Hypokalemia means that the amount of potassium in the blood is lower than normal.Potassium is a chemical, called an electrolyte, that helps regulate the amount of fluid in the body. It also stimulates muscle contraction and helps nerves function properly.Most of the body's potassium is inside of cells, and only a very small amount is in the blood. Because the amount in the blood is so small, minor changes can be life-threatening. CAUSES  Antibiotics.  Diarrhea or vomiting.  Using laxatives too much, which can cause diarrhea.  Chronic kidney disease.  Water pills (diuretics).  Eating disorders (bulimia).  Low magnesium level.  Sweating a lot. SIGNS AND SYMPTOMS  Weakness.  Constipation.  Fatigue.  Muscle cramps.  Mental confusion.  Skipped heartbeats or irregular heartbeat (palpitations).  Tingling or numbness. DIAGNOSIS  Your health care provider can diagnose hypokalemia with blood tests. In addition to checking your potassium level, your health care provider may also check other lab tests. TREATMENT Hypokalemia can be treated with potassium supplements taken by mouth or adjustments in your current medicines. If your potassium level is very low,  you may need to get potassium through a vein (IV) and be monitored in the hospital. A diet high in potassium is also helpful. Foods high in potassium are: 1. Nuts, such as peanuts and pistachios. 2. Seeds, such as sunflower seeds and pumpkin seeds. 3. Peas, lentils, and lima beans. 4. Whole grain and bran cereals and breads. 5. Fresh fruit and vegetables, such as apricots, avocado, bananas, cantaloupe, kiwi, oranges, tomatoes, asparagus, and potatoes. 6. Orange and tomato juices. 7. Red meats. 8. Fruit yogurt. HOME CARE INSTRUCTIONS  Take all medicines as prescribed by your health care provider.  Maintain a healthy diet by including nutritious food, such as fruits, vegetables, nuts, whole grains, and lean meats.  If you are taking a laxative, be sure to follow the directions on the label. SEEK MEDICAL CARE IF:  Your weakness gets worse.  You feel your heart pounding or racing.  You are vomiting or having diarrhea.  You are diabetic and having trouble keeping your blood glucose in the normal range. SEEK IMMEDIATE MEDICAL CARE IF:  You have chest pain, shortness of breath, or dizziness.  You are vomiting or having diarrhea for more than 2 days.  You faint. MAKE SURE YOU:   Understand these instructions. Will watch your condition.Tick Bite Information Ticks are insects that attach themselves to the skin and draw blood for food. There are various types of ticks. Common types include wood ticks and deer ticks. Most ticks live in shrubs and grassy areas. Ticks can climb onto your body when you make contact with leaves or grass where the tick is waiting. The most common places on the body for ticks to attach themselves are the scalp, neck, armpits, waist,  and groin. Most tick bites are harmless, but sometimes ticks carry germs that cause diseases. These germs can be spread to a person during the tick's feeding process. The chance of a disease spreading through a tick bite depends on:     The type of tick.  Time of year.   How long the tick is attached.   Geographic location.  HOW CAN YOU PREVENT TICK BITES? Take these steps to help prevent tick bites when you are outdoors:  Wear protective clothing. Long sleeves and long pants are best.   Wear white clothes so you can see ticks more easily.  Tuck your pant legs into your socks.   If walking on a trail, stay in the middle of the trail to avoid brushing against bushes.  Avoid walking through areas with long grass.  Put insect repellent on all exposed skin and along boot tops, pant legs, and sleeve cuffs.   Check clothing, hair, and skin repeatedly and before going inside.   Brush off any ticks that are not attached.  Take a shower or bath as soon as possible after being outdoors.  WHAT IS THE PROPER WAY TO REMOVE A TICK? Ticks should be removed as soon as possible to help prevent diseases caused by tick bites. 9. If latex gloves are available, put them on before trying to remove a tick.  10. Using fine-point tweezers, grasp the tick as close to the skin as possible. You may also use curved forceps or a tick removal tool. Grasp the tick as close to its head as possible. Avoid grasping the tick on its body. 11. Pull gently with steady upward pressure until the tick lets go. Do not twist the tick or jerk it suddenly. This may break off the tick's head or mouth parts. 12. Do not squeeze or crush the tick's body. This could force disease-carrying fluids from the tick into your body.  13. After the tick is removed, wash the bite area and your hands with soap and water or other disinfectant such as alcohol. 14. Apply a small amount of antiseptic cream or ointment to the bite site.  15. Wash and disinfect any instruments that were used.  Do not try to remove a tick by applying a hot match, petroleum jelly, or fingernail polish to the tick. These methods do not work and may increase the chances of disease  being spread from the tick bite.  WHEN SHOULD YOU SEEK MEDICAL CARE? Contact your health care provider if you are unable to remove a tick from your skin or if a part of the tick breaks off and is stuck in the skin.  After a tick bite, you need to be aware of signs and symptoms that could be related to diseases spread by ticks. Contact your health care provider if you develop any of the following in the days or weeks after the tick bite:  Unexplained fever.  Rash. A circular rash that appears days or weeks after the tick bite may indicate the possibility of Lyme disease. The rash may resemble a target with a bull's-eye and may occur at a different part of your body than the tick bite.  Redness and swelling in the area of the tick bite.   Tender, swollen lymph glands.   Diarrhea.   Weight loss.   Cough.   Fatigue.   Muscle, joint, or bone pain.   Abdominal pain.   Headache.   Lethargy or a change in your level of consciousness.  Difficulty walking or moving your legs.   Numbness in the legs.   Paralysis.  Shortness of breath.   Confusion.   Repeated vomiting.    This information is not intended to replace advice given to you by your health care provider. Make sure you discuss any questions you have with your health care provider.   Document Released: 07/05/2000 Document Revised: 07/29/2014 Document Reviewed: 12/16/2012 Else[employer] Interactive Patient Education [employer]    Will get help right away if you are not doing well or get worse.   This information is not intended to replace advice given to you by your health care provider. Make sure you discuss any questions you have with your health care provider.   Document Released: 07/08/2005 Document Revised: 07/29/2014 Document Reviewed: 01/08/2013 [employer] Interactive Patient Education [employer]

## 2016-02-07 LAB — B. BURGDORFI ANTIBODIES: B burgdorferi Ab IgG+IgM: <0.91 {ISR} (ref 0.00–0.90)

## 2016-02-08 LAB — ROCKY MTN SPOTTED FVR ABS PNL(IGG+IGM)
RMSF IgG: POSITIVE — AB
RMSF IgM: 0.46 index (ref 0.00–0.89)

## 2016-02-08 LAB — RMSF, IGG, IFA: RMSF, IGG, IFA: 1:256 {titer} — ABNORMAL HIGH

## 2017-03-03 ENCOUNTER — Ambulatory Visit (HOSPITAL_COMMUNITY)
Admission: RE | Admit: 2017-03-03 | Discharge: 2017-03-03 | Disposition: A | Discharge status: Home / Self Care | Payer: Medicaid Other | Source: Ambulatory Visit | Attending: Family Medicine | Admitting: Family Medicine

## 2017-03-03 ENCOUNTER — Other Ambulatory Visit (HOSPITAL_COMMUNITY): Payer: Self-pay | Admitting: Family Medicine

## 2017-03-03 DIAGNOSIS — I739 Peripheral vascular disease, unspecified: Secondary | ICD-10-CM | POA: Diagnosis not present

## 2017-03-03 DIAGNOSIS — M25571 Pain in right ankle and joints of right foot: Secondary | ICD-10-CM | POA: Insufficient documentation

## 2017-03-03 DIAGNOSIS — M795 Residual foreign body in soft tissue: Secondary | ICD-10-CM | POA: Insufficient documentation

## 2017-03-03 DIAGNOSIS — M25561 Pain in right knee: Secondary | ICD-10-CM | POA: Insufficient documentation

## 2017-03-03 DIAGNOSIS — G8929 Other chronic pain: Secondary | ICD-10-CM | POA: Insufficient documentation

## 2017-03-03 DIAGNOSIS — M25572 Pain in left ankle and joints of left foot: Secondary | ICD-10-CM | POA: Insufficient documentation

## 2017-03-03 DIAGNOSIS — R52 Pain, unspecified: Secondary | ICD-10-CM | POA: Diagnosis present

## 2018-03-18 ENCOUNTER — Other Ambulatory Visit: Payer: Self-pay | Admitting: Neurosurgery

## 2018-03-18 DIAGNOSIS — M5416 Radiculopathy, lumbar region: Secondary | ICD-10-CM

## 2018-04-01 ENCOUNTER — Ambulatory Visit
Admission: RE | Admit: 2018-04-01 | Discharge: 2018-04-01 | Disposition: A | Discharge status: Home / Self Care | Payer: Medicaid Other | Source: Ambulatory Visit | Attending: Neurosurgery | Admitting: Neurosurgery

## 2018-04-01 DIAGNOSIS — M5416 Radiculopathy, lumbar region: Secondary | ICD-10-CM

## 2018-04-01 MED ORDER — GADOBENATE DIMEGLUMINE 529 MG/ML IV SOLN
20.0000 mL | Freq: Once | INTRAVENOUS | Status: AC | PRN
  Administered 2018-04-01: 20 mL via INTRAVENOUS

## 2018-05-18 ENCOUNTER — Other Ambulatory Visit (HOSPITAL_COMMUNITY): Payer: Medicaid Other

## 2018-05-18 ENCOUNTER — Encounter (HOSPITAL_COMMUNITY): Payer: Self-pay | Admitting: Student

## 2018-05-18 ENCOUNTER — Emergency Department (HOSPITAL_COMMUNITY): Payer: Medicaid Other

## 2018-05-18 ENCOUNTER — Inpatient Hospital Stay (HOSPITAL_COMMUNITY)
Admission: EM | Admit: 2018-05-18 | Discharge: 2018-05-21 | DRG: 291 | Disposition: A | Discharge status: Home / Self Care | Payer: Medicaid Other | Attending: Family Medicine | Admitting: Family Medicine

## 2018-05-18 ENCOUNTER — Other Ambulatory Visit: Payer: Self-pay

## 2018-05-18 DIAGNOSIS — D539 Nutritional anemia, unspecified: Secondary | ICD-10-CM | POA: Diagnosis present

## 2018-05-18 DIAGNOSIS — D649 Anemia, unspecified: Secondary | ICD-10-CM | POA: Diagnosis not present

## 2018-05-18 DIAGNOSIS — Z79891 Long term (current) use of opiate analgesic: Secondary | ICD-10-CM

## 2018-05-18 DIAGNOSIS — Z720 Tobacco use: Secondary | ICD-10-CM

## 2018-05-18 DIAGNOSIS — J9601 Acute respiratory failure with hypoxia: Secondary | ICD-10-CM | POA: Diagnosis present

## 2018-05-18 DIAGNOSIS — R0902 Hypoxemia: Secondary | ICD-10-CM

## 2018-05-18 DIAGNOSIS — Z79899 Other long term (current) drug therapy: Secondary | ICD-10-CM

## 2018-05-18 DIAGNOSIS — T39395A Adverse effect of other nonsteroidal anti-inflammatory drugs [NSAID], initial encounter: Secondary | ICD-10-CM | POA: Diagnosis present

## 2018-05-18 DIAGNOSIS — I5032 Chronic diastolic (congestive) heart failure: Secondary | ICD-10-CM

## 2018-05-18 DIAGNOSIS — E871 Hypo-osmolality and hyponatremia: Secondary | ICD-10-CM | POA: Diagnosis present

## 2018-05-18 DIAGNOSIS — F200 Paranoid schizophrenia: Secondary | ICD-10-CM | POA: Diagnosis present

## 2018-05-18 DIAGNOSIS — I340 Nonrheumatic mitral (valve) insufficiency: Secondary | ICD-10-CM | POA: Diagnosis not present

## 2018-05-18 DIAGNOSIS — E875 Hyperkalemia: Secondary | ICD-10-CM | POA: Diagnosis present

## 2018-05-18 DIAGNOSIS — I5031 Acute diastolic (congestive) heart failure: Secondary | ICD-10-CM | POA: Diagnosis not present

## 2018-05-18 DIAGNOSIS — R160 Hepatomegaly, not elsewhere classified: Secondary | ICD-10-CM

## 2018-05-18 DIAGNOSIS — K219 Gastro-esophageal reflux disease without esophagitis: Secondary | ICD-10-CM | POA: Diagnosis present

## 2018-05-18 DIAGNOSIS — I4510 Unspecified right bundle-branch block: Secondary | ICD-10-CM | POA: Diagnosis present

## 2018-05-18 DIAGNOSIS — I110 Hypertensive heart disease with heart failure: Principal | ICD-10-CM | POA: Diagnosis present

## 2018-05-18 DIAGNOSIS — F3189 Other bipolar disorder: Secondary | ICD-10-CM | POA: Diagnosis present

## 2018-05-18 DIAGNOSIS — F1010 Alcohol abuse, uncomplicated: Secondary | ICD-10-CM | POA: Diagnosis present

## 2018-05-18 DIAGNOSIS — I509 Heart failure, unspecified: Secondary | ICD-10-CM

## 2018-05-18 DIAGNOSIS — K8020 Calculus of gallbladder without cholecystitis without obstruction: Secondary | ICD-10-CM | POA: Diagnosis present

## 2018-05-18 DIAGNOSIS — Z7982 Long term (current) use of aspirin: Secondary | ICD-10-CM

## 2018-05-18 DIAGNOSIS — Z9081 Acquired absence of spleen: Secondary | ICD-10-CM | POA: Diagnosis not present

## 2018-05-18 DIAGNOSIS — J441 Chronic obstructive pulmonary disease with (acute) exacerbation: Secondary | ICD-10-CM | POA: Diagnosis present

## 2018-05-18 DIAGNOSIS — M1990 Unspecified osteoarthritis, unspecified site: Secondary | ICD-10-CM | POA: Diagnosis present

## 2018-05-18 DIAGNOSIS — K700 Alcoholic fatty liver: Secondary | ICD-10-CM | POA: Diagnosis present

## 2018-05-18 DIAGNOSIS — R195 Other fecal abnormalities: Secondary | ICD-10-CM | POA: Diagnosis not present

## 2018-05-18 DIAGNOSIS — F17210 Nicotine dependence, cigarettes, uncomplicated: Secondary | ICD-10-CM | POA: Diagnosis present

## 2018-05-18 DIAGNOSIS — D62 Acute posthemorrhagic anemia: Secondary | ICD-10-CM | POA: Diagnosis present

## 2018-05-18 DIAGNOSIS — K2920 Alcoholic gastritis without bleeding: Secondary | ICD-10-CM | POA: Diagnosis present

## 2018-05-18 DIAGNOSIS — G894 Chronic pain syndrome: Secondary | ICD-10-CM | POA: Diagnosis present

## 2018-05-18 DIAGNOSIS — I5033 Acute on chronic diastolic (congestive) heart failure: Secondary | ICD-10-CM | POA: Diagnosis present

## 2018-05-18 DIAGNOSIS — R0602 Shortness of breath: Secondary | ICD-10-CM | POA: Diagnosis present

## 2018-05-18 LAB — CBC WITH DIFFERENTIAL/PLATELET
Abs Immature Granulocytes: 0.04 10*3/uL (ref 0.00–0.07)
Basophils Absolute: 0.1 10*3/uL (ref 0.0–0.1)
Basophils Relative: 1 %
EOS PCT: 4 %
Eosinophils Absolute: 0.3 10*3/uL (ref 0.0–0.5)
HEMATOCRIT: 30.4 % — AB (ref 39.0–52.0)
HEMOGLOBIN: 9.5 g/dL — AB (ref 13.0–17.0)
Immature Granulocytes: 1 %
LYMPHS ABS: 2.9 10*3/uL (ref 0.7–4.0)
Lymphocytes Relative: 37 %
MCH: 31.4 pg (ref 26.0–34.0)
MCHC: 31.3 g/dL (ref 30.0–36.0)
MCV: 100.3 fL — ABNORMAL HIGH (ref 80.0–100.0)
MONO ABS: 1.6 10*3/uL — AB (ref 0.1–1.0)
Monocytes Relative: 20 %
Neutro Abs: 3.1 10*3/uL (ref 1.7–7.7)
Neutrophils Relative %: 37 %
Platelets: 160 10*3/uL (ref 150–400)
RBC: 3.03 MIL/uL — ABNORMAL LOW (ref 4.22–5.81)
RDW: 16 % — ABNORMAL HIGH (ref 11.5–15.5)
WBC: 7.9 10*3/uL (ref 4.0–10.5)
nRBC: 0 % (ref 0.0–0.2)

## 2018-05-18 LAB — COMPREHENSIVE METABOLIC PANEL
ALT: 17 U/L (ref 0–44)
AST: 35 U/L (ref 15–41)
Albumin: 3.6 g/dL (ref 3.5–5.0)
Alkaline Phosphatase: 69 U/L (ref 38–126)
Anion gap: 11 (ref 5–15)
BILIRUBIN TOTAL: 1.6 mg/dL — AB (ref 0.3–1.2)
BUN: 21 mg/dL — ABNORMAL HIGH (ref 6–20)
CO2: 19 mmol/L — ABNORMAL LOW (ref 22–32)
CREATININE: 0.9 mg/dL (ref 0.61–1.24)
Calcium: 8.9 mg/dL (ref 8.9–10.3)
Chloride: 100 mmol/L (ref 98–111)
GFR calc Af Amer: >60 mL/min (ref >60)
Glucose, Bld: 114 mg/dL — ABNORMAL HIGH (ref 70–99)
Potassium: 3.9 mmol/L (ref 3.5–5.1)
Sodium: 130 mmol/L — ABNORMAL LOW (ref 135–145)
TOTAL PROTEIN: 7.8 g/dL (ref 6.5–8.1)

## 2018-05-18 LAB — I-STAT TROPONIN, ED: Troponin i, poc: 0.09 ng/mL (ref 0.00–0.08)

## 2018-05-18 LAB — BRAIN NATRIURETIC PEPTIDE: B NATRIURETIC PEPTIDE 5: 679 pg/mL — AB (ref 0.0–100.0)

## 2018-05-18 LAB — POC OCCULT BLOOD, ED: Fecal Occult Bld: POSITIVE — AB

## 2018-05-18 LAB — TSH: TSH: 1.968 u[IU]/mL (ref 0.350–4.500)

## 2018-05-18 LAB — MAGNESIUM: Magnesium: 2.3 mg/dL (ref 1.7–2.4)

## 2018-05-18 MED ORDER — OXYCODONE-ACETAMINOPHEN 5-325 MG PO TABS
1.0000 | ORAL_TABLET | ORAL | Status: DC | PRN
  Administered 2018-05-18 – 2018-05-21 (×15): 1 via ORAL
  Filled 2018-05-18 (×15): qty 1

## 2018-05-18 MED ORDER — SODIUM CHLORIDE 0.9% FLUSH: 3.0000 mL | INTRAVENOUS | Status: DC | PRN

## 2018-05-18 MED ORDER — LORAZEPAM 2 MG/ML IJ SOLN: 1.0000 mg | Freq: Four times a day (QID) | INTRAMUSCULAR | Status: AC | PRN

## 2018-05-18 MED ORDER — FUROSEMIDE 10 MG/ML IJ SOLN
20.0000 mg | Freq: Once | INTRAMUSCULAR | Status: AC
  Administered 2018-05-18: 20 mg via INTRAVENOUS
  Filled 2018-05-18: qty 2

## 2018-05-18 MED ORDER — BUDESONIDE 0.5 MG/2ML IN SUSP
0.5000 mg | Freq: Two times a day (BID) | RESPIRATORY_TRACT | Status: DC
  Administered 2018-05-18 – 2018-05-21 (×7): 0.5 mg via RESPIRATORY_TRACT
  Filled 2018-05-18 (×7): qty 2

## 2018-05-18 MED ORDER — POTASSIUM CHLORIDE CRYS ER 20 MEQ PO TBCR: 20.0000 meq | EXTENDED_RELEASE_TABLET | Freq: Once | ORAL | Status: DC

## 2018-05-18 MED ORDER — SODIUM CHLORIDE 0.9% FLUSH
3.0000 mL | Freq: Two times a day (BID) | INTRAVENOUS | Status: DC
  Administered 2018-05-18 – 2018-05-21 (×6): 3 mL via INTRAVENOUS

## 2018-05-18 MED ORDER — SODIUM CHLORIDE 0.9 % IV SOLN: 250.0000 mL | INTRAVENOUS | Status: DC | PRN

## 2018-05-18 MED ORDER — SODIUM CHLORIDE 0.9 % IV SOLN
8.0000 mg/h | INTRAVENOUS | Status: DC
  Administered 2018-05-18 – 2018-05-19 (×3): 8 mg/h via INTRAVENOUS
  Filled 2018-05-18 (×6): qty 80

## 2018-05-18 MED ORDER — FUROSEMIDE 10 MG/ML IJ SOLN: 40.0000 mg | Freq: Once | INTRAMUSCULAR | Status: DC

## 2018-05-18 MED ORDER — PANTOPRAZOLE SODIUM 40 MG IV SOLR: 40.0000 mg | Freq: Two times a day (BID) | INTRAVENOUS | Status: DC

## 2018-05-18 MED ORDER — THIAMINE HCL 100 MG/ML IJ SOLN: 100.0000 mg | Freq: Every day | INTRAMUSCULAR | Status: DC

## 2018-05-18 MED ORDER — ADULT MULTIVITAMIN W/MINERALS CH
1.0000 | ORAL_TABLET | Freq: Every day | ORAL | Status: DC
  Administered 2018-05-18 – 2018-05-21 (×4): 1 via ORAL
  Filled 2018-05-18 (×4): qty 1

## 2018-05-18 MED ORDER — ONDANSETRON HCL 4 MG/2ML IJ SOLN: 4.0000 mg | Freq: Four times a day (QID) | INTRAMUSCULAR | Status: DC | PRN

## 2018-05-18 MED ORDER — FOLIC ACID 1 MG PO TABS
1.0000 mg | ORAL_TABLET | Freq: Every day | ORAL | Status: DC
  Administered 2018-05-18 – 2018-05-21 (×4): 1 mg via ORAL
  Filled 2018-05-18 (×4): qty 1

## 2018-05-18 MED ORDER — IPRATROPIUM-ALBUTEROL 0.5-2.5 (3) MG/3ML IN SOLN
3.0000 mL | Freq: Four times a day (QID) | RESPIRATORY_TRACT | Status: DC
  Administered 2018-05-18 – 2018-05-19 (×5): 3 mL via RESPIRATORY_TRACT
  Filled 2018-05-18 (×5): qty 3

## 2018-05-18 MED ORDER — POTASSIUM CHLORIDE CRYS ER 20 MEQ PO TBCR
20.0000 meq | EXTENDED_RELEASE_TABLET | Freq: Once | ORAL | Status: AC
  Administered 2018-05-18: 20 meq via ORAL
  Filled 2018-05-18: qty 1

## 2018-05-18 MED ORDER — OXYCODONE-ACETAMINOPHEN 10-325 MG PO TABS: 1.0000 | ORAL_TABLET | ORAL | Status: DC | PRN

## 2018-05-18 MED ORDER — LORAZEPAM 1 MG PO TABS: 1.0000 mg | ORAL_TABLET | Freq: Four times a day (QID) | ORAL | Status: AC | PRN

## 2018-05-18 MED ORDER — OXYCODONE HCL 5 MG PO TABS
5.0000 mg | ORAL_TABLET | ORAL | Status: DC | PRN
  Administered 2018-05-18 – 2018-05-21 (×12): 5 mg via ORAL
  Filled 2018-05-18 (×13): qty 1

## 2018-05-18 MED ORDER — VITAMIN B-1 100 MG PO TABS
100.0000 mg | ORAL_TABLET | Freq: Every day | ORAL | Status: DC
  Administered 2018-05-18 – 2018-05-21 (×4): 100 mg via ORAL
  Filled 2018-05-18 (×4): qty 1

## 2018-05-18 MED ORDER — SODIUM CHLORIDE 0.9 % IV SOLN
80.0000 mg | Freq: Once | INTRAVENOUS | Status: AC
  Administered 2018-05-18: 80 mg via INTRAVENOUS
  Filled 2018-05-18: qty 80

## 2018-05-18 MED ORDER — FUROSEMIDE 10 MG/ML IJ SOLN: 20.0000 mg | Freq: Once | INTRAMUSCULAR | Status: DC

## 2018-05-18 MED ORDER — IPRATROPIUM-ALBUTEROL 0.5-2.5 (3) MG/3ML IN SOLN
3.0000 mL | Freq: Once | RESPIRATORY_TRACT | Status: AC
  Administered 2018-05-18: 3 mL via RESPIRATORY_TRACT
  Filled 2018-05-18: qty 3

## 2018-05-18 MED ORDER — ACETAMINOPHEN 325 MG PO TABS: 650.0000 mg | ORAL_TABLET | ORAL | Status: DC | PRN

## 2018-05-18 MED ORDER — METHYLPREDNISOLONE SODIUM SUCC 125 MG IJ SOLR
60.0000 mg | Freq: Four times a day (QID) | INTRAMUSCULAR | Status: DC
  Administered 2018-05-18 – 2018-05-20 (×9): 60 mg via INTRAVENOUS
  Filled 2018-05-18 (×9): qty 2
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
## 2018-05-18 NOTE — ED Notes (Signed)
Pt is getting a breathing treatment then going upstairs

## 2018-05-18 NOTE — ED Provider Notes (Signed)
[facility] EMERGENCY DEPARTMENT Provider Note   CSN: [id number] Arrival date & time: 05/18/18  0800     History   Chief Complaint Chief Complaint  Patient presents with  . Shortness of Breath   HPI Kirk Harper is a 57 y.o. male with a hx of HTN, GERD, schizoaffective disorder, pancreatitis, EtOH abuse, tobacco abuse, and s/p splenectomy who presents to the ED with complaints of dyspnea x 3 weeks. Patient states he first noted bilateral LE edema R>L and subsequently dyspnea. He states sxs have been progressively worsening. Relays that dyspnea was initially intermittent and is now constant. He notes the dyspnea more when he is exerting himself and when he is laying flat, he is no longer able to sleep in the supine position, but has instead started sleeping sitting upright. Does report episodes of PND. He was seen by PCP 3 weeks prior w/ onset and was told it was likely the weather changing. Other than what is mentioned above no specific alleviating/aggravating factors. He has had some congestion and cough productive of clear mucous sputum. Denies fever, chills, chest pain, nausea, vomiting, or abdominal pain. Denies hemoptysis, recent surgery/trauma, recent long travel, hormone use, personal hx of cancer, or hx of DVT/PE. Patient does not require oxygen at baseline.   HPI  Past Medical History:  Diagnosis Date  . Alcohol abuse   . Back pain   . Bipolar affective (HCC)   . DDD (degenerative disc disease), lumbar   . GERD (gastroesophageal reflux disease)    takes tums prn  . Hypertension   . Pancreatitis   . Paranoia [facility])     Patient Active Problem List   Diagnosis Date Noted  . Lumbar stenosis with neurogenic claudication 09/26/2015  . Pancreatitis, acute 02/12/2013  . Chronic pancreatitis (HCC) 02/12/2013  . Dehydration 02/12/2013  . Schizoaffective disorder (HCC) 02/12/2013  . Alcohol abuse 02/12/2013  . Alcoholic liver disease (HCC) 02/12/2013  . Lumbar disc disease  02/12/2013  . Chigger infestation 02/12/2013    Past Surgical History:  Procedure Laterality Date  . LUMBAR LAMINECTOMY/DECOMPRESSION MICRODISCECTOMY N/A 09/26/2015   Procedure: L3-4 L4-5 L5-S1 Laminectomy/Foraminotomy;  Surgeon: Hilda Lias, MD;  Location: [facility]UR[facility] ORS;  Service: Neurosurgery;  Laterality: N/A;  L3-4 L4-5 L5-S1 Laminectomy/Foraminotomy  . MULTIPLE EXTRACTIONS WITH ALVEOLOPLASTY N/A 06/28/2013   Procedure: MULTIPLE EXTRACTION WITH ALVEOLOPLASTY AND TORI ;  Surgeon: Georgia Lopes, DDS;  Location: [facility] OR;  Service: Oral Surgery;  Laterality: N/A;  . spleen  spleen removed  . SPLENECTOMY          Home Medications    Prior to Admission medications   Medication Sig Start Date End Date Taking? Authorizing Provider  aspirin-acetaminophen-caffeine (EXCEDRIN EXTRA STRENGTH) [phone] MG tablet Take 2 tablets by mouth daily as needed for headache.    [provider]  gabapentin (NEURONTIN) 100 MG capsule Take 1 capsule (100 mg total) by mouth 3 (three) times daily. Patient not taking: Reported on 02/06/2016 10/31/15   Loren Racer, MD  oxyCODONE-acetaminophen (PERCOCET) 10-325 MG per tablet Take 1-2 tablets by mouth every 4 (four) hours as needed for pain. 06/28/13   Ocie Doyne, DDS  pantoprazole (PROTONIX) 40 MG tablet Take 40 mg by mouth daily.    [provider]  potassium chloride 20 MEQ TBCR Take 20 mEq by mouth 2 (two) times daily. 02/06/16   Eyvonne Mechanic, PA-C    Family History No family history on file.  Social History Social History   Tobacco Use  .  Smoking status: Current Every Day Smoker    Packs/day: 0.50    Years: 40.00    Pack years: 20.00    Types: Cigarettes  Substance Use Topics  . Alcohol use: Yes    Alcohol/week: 60.0 standard drinks    Types: 48 Standard drinks or equivalent, 12 Cans of beer per week    Comment: occasionally  . Drug use: No     Allergies   Patient has no known allergies.   Review of  Systems Review of Systems  Constitutional: Negative for chills and fever.  HENT: Positive for congestion.   Respiratory: Positive for cough and shortness of breath. Negative for wheezing.   Cardiovascular: Positive for leg swelling. Negative for chest pain and palpitations.  Gastrointestinal: Negative for abdominal pain, nausea and vomiting.  Neurological: Negative for syncope, weakness, light-headedness and numbness.  All other systems reviewed and are negative.   Physical Exam Updated Vital Signs BP 112/88   Pulse 89   Temp 97.9 F (36.6 C) (Oral)   Resp 17   Ht 6\' 3"  (1.905 m)   Wt 101.6 kg   SpO2 96%   BMI 28.00 kg/m   Physical Exam  Constitutional: He appears well-developed and well-nourished.  Non-toxic appearance. No distress.  HENT:  Head: Normocephalic and atraumatic.  Eyes: Conjunctivae are normal. Right eye exhibits no discharge. Left eye exhibits no discharge.  Neck: Neck supple.  Cardiovascular: Normal rate and regular rhythm.  Pulses:      Dorsalis pedis pulses are 2+ on the right side, and 2+ on the left side.  Pulmonary/Chest: He has decreased breath sounds (bibasilar). He has no wheezes. He has no rhonchi. He has rales (bibasilar).  Patient speaks in short sentences.  SpO2 in the upper 80s on RA- improved to >95% on 2L via [place]  Abdominal: Soft. There is no tenderness.  Musculoskeletal: He exhibits edema (2+ symmetric to the knees). He exhibits no tenderness.  No overlying erythema/warmth or open wounds.   Neurological: He is alert.  Clear speech.   Skin: Skin is warm and dry. Capillary refill takes less than 2 seconds. No rash noted.  Psychiatric: He has a normal mood and affect. His behavior is normal.  Nursing note and vitals reviewed.   ED Treatments / Results  Labs (all labs ordered are listed, but only abnormal results are displayed) Labs Reviewed  COMPREHENSIVE METABOLIC PANEL - Abnormal; Notable for the following components:      Result Value    Sodium 130 (*)    CO2 19 (*)    Glucose, Bld 114 (*)    BUN 21 (*)    Total Bilirubin 1.6 (*)    All other components within normal limits  BRAIN NATRIURETIC PEPTIDE - Abnormal; Notable for the following components:   B Natriuretic Peptide 679.0 (*)    All other components within normal limits  CBC WITH DIFFERENTIAL/PLATELET - Abnormal; Notable for the following components:   RBC 3.03 (*)    Hemoglobin 9.5 (*)    HCT 30.4 (*)    MCV 100.3 (*)    RDW 16.0 (*)    Monocytes Absolute 1.6 (*)    All other components within normal limits  I-STAT TROPONIN, ED - Abnormal; Notable for the following components:   Troponin i, poc 0.09 (*)    All other components within normal limits  POC OCCULT BLOOD, ED - Abnormal; Notable for the following components:   Fecal Occult Bld POSITIVE (*)    All other components  within normal limits  TSH  MAGNESIUM    EKG EKG Interpretation  Date/Time:  Monday May 18 2018 08:29:14 EDT Ventricular Rate:  90 PR Interval:    QRS Duration: 134 QT Interval:  414 QTC Calculation: 507 R Axis:   104 Text Interpretation:  Sinus rhythm Nonspecific intraventricular conduction delay Inferior infarct, age indeterminate When compared with ECG of 09/21/2015 No significant change was found Confirmed by Samuel Jester [phone]) on 05/18/2018 9:08:04 AM   Radiology Dg Chest 2 View  Result Date: 05/18/2018 CLINICAL DATA:  Peripheral edema, shortness of breath.  Smoker. EXAM: CHEST - 2 VIEW COMPARISON:  PA and lateral chest x-ray of February 06, 2016 FINDINGS: The lungs are well-expanded. The interstitial markings are increased bilaterally. There are new small bilateral pleural effusions. The cardiac silhouette is mildly enlarged. The central pulmonary vascularity is prominent. The trachea is midline. The bony thorax exhibits no acute abnormality. IMPRESSION: CHF with mild interstitial edema and small bilateral pleural effusions. Underlying COPD. Electronically Signed    By: David  [place] M.D.   On: 05/18/2018 10:28    Procedures Procedures (including critical care time)  CRITICAL CARE Performed by: Harvie Heck   Total critical care time: 35 minutes  Critical care time was exclusive of separately billable procedures and treating other patients.  Critical care was necessary to treat or prevent imminent or life-threatening deterioration.  Critical care was time spent personally by me on the following activities: development of treatment plan with patient and/or surrogate as well as nursing, discussions with consultants, evaluation of patient's response to treatment, examination of patient, obtaining history from patient or surrogate, ordering and performing treatments and interventions, ordering and review of laboratory studies, ordering and review of radiographic studies, pulse oximetry and re-evaluation of patient's condition.  Medications Ordered in ED Medications  furosemide (LASIX) injection 20 mg (20 mg Intravenous Given 05/18/18 0958)  potassium chloride SA (K-DUR,KLOR-CON) CR tablet 20 mEq (20 mEq Oral Given 05/18/18 6045)    Initial Impression / Assessment and Plan / ED Course  I have reviewed the triage vital signs and the nursing notes.  Pertinent labs & imaging results that were available during my care of the patient were reviewed by me and considered in my medical decision making (see chart for details).  Patient presents to the ED w/ progressively worsening dyspnea and bilateral LE edema. Patient with new oxygen requirement, desating into upper 80s on RA, improved on supplement O2 via [place]. Exam with bibasilar crackles/decreased breath sounds as well as bilateral LE pitting edema. DDX: CHF, COPD, ACS, pulmonary embolism, pneumonia, pneumothorax. Will obtain labs, CXR, and EKG.   CBC/BMP reviewed, notable for anemia, mild hyponatremia, mildly low bicarb. Patient with hgb of 9.5, significant decreased from 15.3 two years prior,  discussed with patient who reports intermittent dark appearing stools. Fecal occult blood testing performed with RN as chaperone, no gross melena/hematochezia, + fecal occult testing.   His troponin is elevated at 0.09, at this time suspect this is secondary to demand. Likely CHF given hx and exam, his BNP is elevated at 679, CXR reviewed by me personally appears to have some vascular congestion and findings suspicious for CHF, pending formal radiology read. Will consult cardiology and plan for admission.   09:33: CONSULT: Discussed case with cardiologist Dr. Wyline Mood- in agreement with Lasix, would hold heparin and trend troponins. Cardiology team will formally consult, requesting patient be admitted at this facility by hospitalist team.   Lasix and potassium ordered, will start w/ low  dose given soft pressures and defer to cardiology for further recommendations. Pending call back from hospitalist service.   10:19: CONSULT: Discussed case with hospitalist Dr. Arbutus Leas, accepts admission.   Findings and plan of care discussed with supervising physician Dr. Clarene Duke who is in agreement.   Final Clinical Impressions(s) / ED Diagnoses   Final diagnoses:  Acute congestive heart failure, unspecified heart failure type (HCC)  Anemia, unspecified type  Hypoxia    ED Discharge Orders    None       Cherly Anderson, PA-C 05/18/18 1335    Samuel Jester, DO 05/19/18 2032

## 2018-05-18 NOTE — ED Notes (Signed)
CRITICAL VALUE ALERT  Critical Value:  Troponin 0.09   Date & Time Notied:  05/18/18 0858  Provider Notified: Micheal Likens PA  Orders Received/Actions taken: Inpatient Consult to Cardiology

## 2018-05-18 NOTE — ED Triage Notes (Signed)
Pt went to PCP for bilateral leg swelling and sob 3 weeks ago.   Pt states worsening sob and swelling since yesterday with exertion.  89% on room air.

## 2018-05-18 NOTE — ED Notes (Signed)
Have paged respiratory  

## 2018-05-18 NOTE — H&P (Signed)
History and Physical  Kirk Harper WJX:[id number] DOB: 1961/07/20 DOA: 05/18/2018   PCP: Oval Linsey, MD   Patient coming from: Home  Chief Complaint: dyspnea  HPI:  Kirk Harper is a 57 y.o. male with medical history of hypertension, tobacco abuse, alcohol abuse, pancreatitis, peripheral neuropathy presenting with 3-week history of worsening shortness of breath with associated lower extremity edema and orthopnea type symptoms.  The patient denies any fevers, chills, vomiting, diarrhea, abdominal pain, headache, neck pain.  The patient states that he is only been taking half of his usual antihypertensive medication at home because it makes him feel weak.  He does not recall the name of the medication.  Nevertheless, the patient has had increasing dyspnea on exertion with orthopnea type symptoms over the past 3 weeks with increasing abdominal girth.  Unfortunately, he continues to smoke up to 1 pack/day for at least the last 20 years.  He continues to drink a sixpack of beer on a daily basis which he has done for the last 20 years.  He denies any other illegal drug use.  The patient is also been taking ibuprofen and Excedrin almost on a daily basis for chronic daily headache.  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  He denies any abdominal pain, dysuria, hematuria.  In the emergency department, the patient was afebrile and hemodynamically stable.  He was hypoxic with oxygen saturation in the low 80s on room air.  The patient improved to 95% on 2 L supplement oxygen.  He was given furosemide 20 mg IV.  Cardiology was consulted.  Assessment/Plan: Acute respiratory failure with hypoxia -Multifactorial including acute CHF and COPD exacerbation -Currently stable on 2 L -Wean oxygen as tolerated  Acute CHF, type unspecified -Cardiology consultation -Re-dose IV furosemide today per  cardiology, and reassess clinically -Echocardiogram -Daily weights -Accurate I's and O's  COPD exacerbation -Start Pulmicort -Start duo nebs -Start IV Solu-Medrol  Acute blood loss anemia -Start IV Protonix drip -Consult GI -Likely due to combination of alcoholic gastritis and NSAID induced PUD; cannot rule out variceal bleed -Clear liquid diet  Elevated troponin -no chest pain -cardiology consult -doubt ACs -cycle troponins  Chronic pain syndrome -Continue home dose Percocet  Alcohol abuse -Alcohol withdrawal protocol  Tobacco abuse I have discussed tobacco cessation with the patient.  I have counseled the patient regarding the negative impacts of continued tobacco use including but not limited to lung cancer, COPD, and cardiovascular disease.  I have discussed alternatives to tobacco and modalities that may help facilitate tobacco cessation including but not limited to biofeedback, hypnosis, and medications.  Total time spent with tobacco counseling was 4 minutes.  Hyponatremia -due CHF and possible underlying liver cirrhosis      Past Medical History:  Diagnosis Date  . Alcohol abuse   . Back pain   . Bipolar affective (HCC)   . DDD (degenerative disc disease), lumbar   . GERD (gastroesophageal reflux disease)    takes tums prn  . Hypertension   . Pancreatitis   . Paranoia [facility])    Past Surgical History:  Procedure Laterality Date  . LUMBAR LAMINECTOMY/DECOMPRESSION MICRODISCECTOMY N/A 09/26/2015   Procedure: L3-4 L4-5 L5-S1 Laminectomy/Foraminotomy;  Surgeon: Hilda Lias, MD;  Location: [facility] OR[facility];  Service: Neurosurgery;  Laterality: N/A;  L3-4 L4-5 L5-S1 Laminectomy/Foraminotomy  . MULTIPLE EXTRACTIONS WITH ALVEOLOPLASTY N/A 06/28/2013  Procedure: MULTIPLE EXTRACTION WITH ALVEOLOPLASTY AND TORI ;  Surgeon: Georgia Lopes, DDS;  Location[facility];  Service: Oral Surgery;  Laterality: N/A;  . spleen  spleen removed  . SPLENECTOMY     Social History:   reports that he has been smoking cigarettes. He has a 20.00 pack-year smoking history. He has never used smokeless tobacco. He reports that he drinks about 60.0 standard drinks of alcohol per week. He reports that he does not use drugs.   History reviewed. No pertinent family history.   No Known Allergies   Prior to Admission medications   Medication Sig Start Date End Date Taking? Authorizing Provider  aspirin-acetaminophen-caffeine (EXCEDRIN EXTRA STRENGTH) [phone] MG tablet Take 2 tablets by mouth daily as needed for headache.    [provider]  gabapentin (NEURONTIN) 100 MG capsule Take 1 capsule (100 mg total) by mouth 3 (three) times daily. Patient not taking: Reported on 02/06/2016 10/31/15   Loren Racer, MD  oxyCODONE-acetaminophen (PERCOCET) 10-325 MG per tablet Take 1-2 tablets by mouth every 4 (four) hours as needed for pain. 06/28/13   Ocie Doyne, DDS  pantoprazole (PROTONIX) 40 MG tablet Take 40 mg by mouth daily.    [provider]  potassium chloride 20 MEQ TBCR Take 20 mEq by mouth 2 (two) times daily. 02/06/16   Hedges, Tinnie Gens, PA-C    Review of Systems:  Constitutional:  No weight loss, night sweats, Fevers, chills Head&Eyes: No headache.  No vision loss.  No eye pain or scotoma ENT:  No Difficulty swallowing,Tooth/dental problems,Sore throat,  No ear ache, post nasal drip,  Cardio-vascular:  Noextremities,  dizziness, palpitations  GI:  No  abdominal pain, vomiting, diarrhea, loss of appetite, hematochezia, melena, heartburn, indigestion, Resp:  No shortness of breath with exertion or at rest. No cough. No coughing up of blood .No wheezing.No chest wall deformity  Skin:  no rash or lesions.  GU:  no dysuria, change in color of urine, no urgency or frequency. No flank pain.  Musculoskeletal:  Complains of chronic back pain Psych:  No change in mood or affect. No depression or anxiety. Neurologic: No headache, no dysesthesia, no  focal weakness, no vision loss. No syncope  Physical Exam: Vitals:   05/18/18 0833 05/18/18 0900 05/18/18 0930 05/18/18 1000  BP: 98/75 96/77 94/78  112/88  Pulse: 89 87 88 89  Resp: (!) 28 (!) 23 20 17   Temp:      TempSrc:      SpO2: 96% 93% 96% 96%  Weight:      Height:       General:  A&O x 3, NAD, nontoxic, pleasant/cooperative Head/Eye: No conjunctival hemorrhage, no icterus, [place]/AT, No nystagmus ENT:  No icterus,  No thrush, good dentition, no pharyngeal exudate Neck:  No masses, no lymphadenpathy, no bruits CV:  RRR, no rub, no gallop, no S3 Lung: Bilateral crackles with bilateral expiratory wheeze. Abdomen: soft/NT, +BS, nondistended, no peritoneal signs Ext: No cyanosis, No rashes, No petechiae, No lymphangitis, 2+ lower extremity edema Neuro: CNII-XII intact, strength 4/5 in bilateral upper and lower extremities, no dysmetria  Labs on Admission:  Basic Metabolic Panel: Recent Labs  Lab 05/18/18 0831  NA 130*  K 3.9  CL 100  CO2 19*  GLUCOSE 114*  BUN 21*  CREATININE 0.90  CALCIUM 8.9  MG 2.3   Liver Function Tests: Recent Labs  Lab 05/18/18 0831  AST 35  ALT 17  ALKPHOS 69  BILITOT 1.6*  PROT 7.8  ALBUMIN  3.6   No results for input(s): LIPASE, AMYLASE in the last 168 hours. No results for input(s): AMMONIA in the last 168 hours. CBC: Recent Labs  Lab 05/18/18 0828  WBC 7.9  NEUTROABS 3.1  HGB 9.5*  HCT 30.4*  MCV 100.3*  PLT 160   Coagulation Profile: No results for input(s): INR, PROTIME in the last 168 hours. Cardiac Enzymes: No results for input(s): CKTOTAL, CKMB, CKMBINDEX, TROPONINI in the last 168 hours. BNP: Invalid input(s): POCBNP CBG: No results for input(s): GLUCAP in the last 168 hours. Urine analysis:    Component Value Date/Time   COLORURINE YELLOW 04/20/2013 1000   APPEARANCEUR CLEAR 04/20/2013 1000   LABSPEC 1.020 04/20/2013 1000   PHURINE 6.0 04/20/2013 1000   GLUCOSEU NEGATIVE 04/20/2013 1000   HGBUR NEGATIVE  04/20/2013 1000   BILIRUBINUR NEGATIVE 04/20/2013 1000   KETONESUR NEGATIVE 04/20/2013 1000   PROTEINUR TRACE (A) 04/20/2013 1000   UROBILINOGEN 0.2 04/20/2013 1000   NITRITE NEGATIVE 04/20/2013 1000   LEUKOCYTESUR NEGATIVE 04/20/2013 1000   Sepsis Labs: @LABRCNTIP (procalcitonin:4,lacticidven:4) )No results found for this or any previous visit (from the past 240 hour(s)).   Radiological Exams on Admission: Dg Chest 2 View  Result Date: 05/18/2018 CLINICAL DATA:  Peripheral edema, shortness of breath.  Smoker. EXAM: CHEST - 2 VIEW COMPARISON:  PA and lateral chest x-ray of February 06, 2016 FINDINGS: The lungs are well-expanded. The interstitial markings are increased bilaterally. There are new small bilateral pleural effusions. The cardiac silhouette is mildly enlarged. The central pulmonary vascularity is prominent. The trachea is midline. The bony thorax exhibits no acute abnormality. IMPRESSION: CHF with mild interstitial edema and small bilateral pleural effusions. Underlying COPD. Electronically Signed   By: Lana Flaim  [place] M.D.   On: 05/18/2018 10:28    EKG: Independently reviewed. Sinus, IVCD    Time spent:60 minutes Code Status:   FULL Family Communication:  No Family at bedside Disposition Plan: expect 2-3 day hospitalization Consults called: cardiology, GI DVT Prophylaxis:    SCDs  Catarina Hartshorn, DO  [facility] Pager [phone]  If 7PM-7AM, please contact night-coverage [email or web address] Password [email or web address] 05/18/2018, 11:14 AM

## 2018-05-18 NOTE — ED Notes (Signed)
Pt to chest xray.

## 2018-05-19 ENCOUNTER — Inpatient Hospital Stay (HOSPITAL_COMMUNITY): Payer: Medicaid Other

## 2018-05-19 ENCOUNTER — Encounter (HOSPITAL_COMMUNITY): Payer: Self-pay | Admitting: Gastroenterology

## 2018-05-19 ENCOUNTER — Other Ambulatory Visit (HOSPITAL_COMMUNITY): Payer: Medicaid Other

## 2018-05-19 DIAGNOSIS — D649 Anemia, unspecified: Secondary | ICD-10-CM

## 2018-05-19 DIAGNOSIS — I340 Nonrheumatic mitral (valve) insufficiency: Secondary | ICD-10-CM

## 2018-05-19 DIAGNOSIS — I5032 Chronic diastolic (congestive) heart failure: Secondary | ICD-10-CM

## 2018-05-19 DIAGNOSIS — R160 Hepatomegaly, not elsewhere classified: Secondary | ICD-10-CM

## 2018-05-19 LAB — BASIC METABOLIC PANEL
Anion gap: 8 (ref 5–15)
BUN: 26 mg/dL — AB (ref 6–20)
CHLORIDE: 104 mmol/L (ref 98–111)
CO2: 20 mmol/L — ABNORMAL LOW (ref 22–32)
CREATININE: 1.08 mg/dL (ref 0.61–1.24)
Calcium: 8.8 mg/dL — ABNORMAL LOW (ref 8.9–10.3)
GFR calc Af Amer: >60 mL/min (ref >60)
GFR calc non Af Amer: >60 mL/min (ref >60)
GLUCOSE: 160 mg/dL — AB (ref 70–99)
Potassium: 5.2 mmol/L — ABNORMAL HIGH (ref 3.5–5.1)
SODIUM: 132 mmol/L — AB (ref 135–145)

## 2018-05-19 LAB — CBC
HEMATOCRIT: 29.5 % — AB (ref 39.0–52.0)
HEMOGLOBIN: 9.1 g/dL — AB (ref 13.0–17.0)
MCH: 31.3 pg (ref 26.0–34.0)
MCHC: 30.8 g/dL (ref 30.0–36.0)
MCV: 101.4 fL — ABNORMAL HIGH (ref 80.0–100.0)
Platelets: 168 10*3/uL (ref 150–400)
RBC: 2.91 MIL/uL — AB (ref 4.22–5.81)
RDW: 15.9 % — ABNORMAL HIGH (ref 11.5–15.5)
WBC: 6.1 10*3/uL (ref 4.0–10.5)
nRBC: 0 % (ref 0.0–0.2)

## 2018-05-19 LAB — RETICULOCYTES
IMMATURE RETIC FRACT: 18 % — AB (ref 2.3–15.9)
RBC.: 2.76 MIL/uL — AB (ref 4.22–5.81)
RETIC CT PCT: 3 % (ref 0.4–3.1)
Retic Count, Absolute: 82.2 10*3/uL (ref 19.0–186.0)

## 2018-05-19 LAB — IRON AND TIBC
Iron: 44 ug/dL — ABNORMAL LOW (ref 45–182)
Saturation Ratios: 9 % — ABNORMAL LOW (ref 17.9–39.5)
TIBC: 486 ug/dL — AB (ref 250–450)
UIBC: 442 ug/dL

## 2018-05-19 LAB — ECHOCARDIOGRAM COMPLETE
Height: 75 in
WEIGHTICAEL: 3650.82 [oz_av]

## 2018-05-19 LAB — FERRITIN: Ferritin: 36 ng/mL (ref 24–336)

## 2018-05-19 LAB — FOLATE: Folate: 35.8 ng/mL (ref >5.9)

## 2018-05-19 LAB — HIV ANTIBODY (ROUTINE TESTING W REFLEX): HIV Screen 4th Generation wRfx: NONREACTIVE

## 2018-05-19 LAB — TROPONIN I: TROPONIN I: 0.07 ng/mL — AB (ref <0.03)

## 2018-05-19 LAB — VITAMIN B12: VITAMIN B 12: 418 pg/mL (ref 180–914)

## 2018-05-19 MED ORDER — FUROSEMIDE 10 MG/ML IJ SOLN
40.0000 mg | Freq: Two times a day (BID) | INTRAMUSCULAR | Status: AC
  Administered 2018-05-19 (×2): 40 mg via INTRAVENOUS
  Filled 2018-05-19 (×2): qty 4

## 2018-05-19 MED ORDER — IPRATROPIUM-ALBUTEROL 0.5-2.5 (3) MG/3ML IN SOLN
3.0000 mL | Freq: Three times a day (TID) | RESPIRATORY_TRACT | Status: DC
  Administered 2018-05-20 – 2018-05-21 (×4): 3 mL via RESPIRATORY_TRACT
  Filled 2018-05-19 (×4): qty 3

## 2018-05-19 MED ORDER — PANTOPRAZOLE SODIUM 40 MG PO TBEC
40.0000 mg | DELAYED_RELEASE_TABLET | Freq: Two times a day (BID) | ORAL | Status: DC
  Administered 2018-05-19 – 2018-05-21 (×4): 40 mg via ORAL
  Filled 2018-05-19 (×5): qty 1

## 2018-05-19 NOTE — Progress Notes (Signed)
RUQ Ultrasound: no cirrhosis. Gallstones but asymptomatic. Low normal ferritin, low iron. Recommending colonoscopy but he has declined. Agreeable to EGD when clinically stable. Gelene Mink, PhD, ANP-BC [facility]

## 2018-05-19 NOTE — Progress Notes (Signed)
PROGRESS NOTE  Kirk Harper ZOX:[id number] DOB: [birth year] DOA: 05/18/2018 PCP: Oval Linsey, MD  Brief History:  57 y.o. male with medical history of hypertension, tobacco abuse, alcohol abuse, pancreatitis, peripheral neuropathy presenting with 3-week history of worsening shortness of breath with associated lower extremity edema and orthopnea type symptoms.  The patient has had increasing dyspnea on exertion with orthopnea type symptoms over the past 3 weeks with increasing abdominal girth.  Unfortunately, he continues to smoke up to 1 pack/day for at least the last 20 years.  He continues to drink a sixpack of beer on a daily basis which he has done for the last 20 years.  He denies any other illegal drug use.  The patient is also been taking ibuprofen and Excedrin almost on a daily basis for chronic daily headache.  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  He denies any abdominal pain, dysuria, hematuria.  Cardiology and GI were consulted to assist with management.   Assessment/Plan: Acute respiratory failure with hypoxia -Multifactorial including acute CHF and COPD exacerbation -Currently stable on 2 L -Wean oxygen as tolerated  Acute Diastolic CHF -Cardiology consultation appreciated -Continue IV furosemide today per cardiology, and reassess clinically -Echocardiogram--EF 55-60%, no WMA, indeterminant diastolic function -Daily weights -Accurate I's and O's  COPD exacerbation -Started Pulmicort -Started duo nebs -Continue IV Solu-Medrol  Acute blood loss anemia -Started IV Protonix drip>>>po protonix -pt had Hgb 15.3 on 02/06/16 -presented with Hgb 9.5 -Consult GI--plan EGD when respiratory status improves -Likely due to combination of alcoholic gastritis and NSAID induced PUD; cannot rule out variceal bleed -Clear liquid diet  Elevated troponin -no chest  pain -cardiology consult appreciated -doubt ACs -cycle troponins--flat  Transaminasemia -due to Etoh and fatty liver -RUQ US--cholelithiasis with mild GB wall thickening with pericholecystic fluid -pt asymptomatic-->monitor clinically  Chronic pain syndrome -Continue home dose Percocet  Alcohol abuse -Alcohol withdrawal protocol  Tobacco abuse I have discussed tobacco cessation with the patient.  I have counseled the patient regarding the negative impacts of continued tobacco use including but not limited to lung cancer, COPD, and cardiovascular disease.  I have discussed alternatives to tobacco and modalities that may help facilitate tobacco cessation including but not limited to biofeedback, hypnosis, and medications.  Total time spent with tobacco counseling was 4 minutes.  Hyponatremia -due CHF  -improving with diuresis   Disposition Plan:   Home in 1-2 days  Family Communication:   Family at bedside updated 10/29--Total time spent 35 minutes.  Greater than 50% spent face to face counseling and coordinating care.   Consultants:  GI, cardiology  Code Status:  FULL   DVT Prophylaxis:  SCDs   Procedures: As Listed in Progress Note Above  Antibiotics: None    Subjective: Pt is breathing a litter better.  No n/v/d.  No chest pain.  Still with nonproductive cough.  No f/c, headache  Objective: Vitals:   05/19/18 0725 05/19/18 0739 05/19/18 1446 05/19/18 1548  BP:    97/73  Pulse:    99  Resp:    18  Temp:    97.8 F (36.6 C)  TempSrc:    Oral  SpO2:  96% 97% 95%  Weight: 103.5 kg     Height:        Intake/Output Summary (Last 24 hours) at 05/19/2018 1715 Last data  filed at 05/19/2018 1300 Gross per 24 hour  Intake 1068.36 ml  Output 800 ml  Net 268.36 ml   Weight change:  Exam:   General:  Pt is alert, follows commands appropriately, not in acute distress  HEENT: No icterus, No thrush, No neck mass, [place]/AT  Cardiovascular: RRR, S1/S2, no  rubs, no gallops  Respiratory: bibasilar crackles, bibasilar wheeze  Abdomen: Soft/+BS, non tender, non distended, no guarding  Extremities: 1+ LE edema, No lymphangitis, No petechiae, No rashes, no synovitis   Data Reviewed: I have personally reviewed following labs and imaging studies Basic Metabolic Panel: Recent Labs  Lab 05/18/18 0831 05/19/18 0451  NA 130* 132*  K 3.9 5.2*  CL 100 104  CO2 19* 20*  GLUCOSE 114* 160*  BUN 21* 26*  CREATININE 0.90 1.08  CALCIUM 8.9 8.8*  MG 2.3  --    Liver Function Tests: Recent Labs  Lab 05/18/18 0831  AST 35  ALT 17  ALKPHOS 69  BILITOT 1.6*  PROT 7.8  ALBUMIN 3.6   No results for input(s): LIPASE, AMYLASE in the last 168 hours. No results for input(s): AMMONIA in the last 168 hours. Coagulation Profile: No results for input(s): INR, PROTIME in the last 168 hours. CBC: Recent Labs  Lab 05/18/18 0828 05/19/18 0451  WBC 7.9 6.1  NEUTROABS 3.1  --   HGB 9.5* 9.1*  HCT 30.4* 29.5*  MCV 100.3* 101.4*  PLT 160 168   Cardiac Enzymes: Recent Labs  Lab 05/19/18 0451  TROPONINI 0.07*   BNP: Invalid input(s): POCBNP CBG: No results for input(s): GLUCAP in the last 168 hours. HbA1C: No results for input(s): HGBA1C in the last 72 hours. Urine analysis:    Component Value Date/Time   COLORURINE YELLOW 04/20/2013 1000   APPEARANCEUR CLEAR 04/20/2013 1000   LABSPEC 1.020 04/20/2013 1000   PHURINE 6.0 04/20/2013 1000   GLUCOSEU NEGATIVE 04/20/2013 1000   HGBUR NEGATIVE 04/20/2013 1000   BILIRUBINUR NEGATIVE 04/20/2013 1000   KETONESUR NEGATIVE 04/20/2013 1000   PROTEINUR TRACE (A) 04/20/2013 1000   UROBILINOGEN 0.2 04/20/2013 1000   NITRITE NEGATIVE 04/20/2013 1000   LEUKOCYTESUR NEGATIVE 04/20/2013 1000   Sepsis Labs: @LABRCNTIP (procalcitonin:4,lacticidven:4) )No results found for this or any previous visit (from the past 240 hour(s)).   Scheduled Meds: . budesonide (PULMICORT) nebulizer solution  0.5 mg  Nebulization BID  . folic acid  1 mg Oral Daily  . furosemide  40 mg Intravenous BID  . ipratropium-albuterol  3 mL Nebulization Q6H  . methylPREDNISolone (SOLU-MEDROL) injection  60 mg Intravenous Q6H  . multivitamin with minerals  1 tablet Oral Daily  . pantoprazole  40 mg Oral BID AC  . sodium chloride flush  3 mL Intravenous Q12H  . thiamine  100 mg Oral Daily   Or  . thiamine  100 mg Intravenous Daily   Continuous Infusions: . sodium chloride      Procedures/Studies: Dg Chest 2 View  Result Date: 05/18/2018 CLINICAL DATA:  Peripheral edema, shortness of breath.  Smoker. EXAM: CHEST - 2 VIEW COMPARISON:  PA and lateral chest x-ray of February 06, 2016 FINDINGS: The lungs are well-expanded. The interstitial markings are increased bilaterally. There are new small bilateral pleural effusions. The cardiac silhouette is mildly enlarged. The central pulmonary vascularity is prominent. The trachea is midline. The bony thorax exhibits no acute abnormality. IMPRESSION: CHF with mild interstitial edema and small bilateral pleural effusions. Underlying COPD. Electronically Signed   By: Makaylyn Sinyard  [place] M.D.  On: 05/18/2018 10:28   US Abdomen Limited Ruq  Result Date: 05/19/2018 CLINICAL DATA:  History of hepatomegaly.  Previous splenectomy. EXAM: ULTRASOUND ABDOMEN LIMITED RIGHT UPPER QUADRANT COMPARISON:  Abdominal and pelvic CT scan of April 20, 2013 FINDINGS: Gallbladder: The gallbladder is adequately distended. There are echogenic mobile stones measuring up to 9 mm in diameter. There is mild gallbladder wall thickening to 4 mm. There is a small amount of pericholecystic fluid. There is no positive sonographic Murphy's sign. Common bile duct: Diameter: 5 mm.  No intraluminal stones or sludge are observed. Liver: The hepatic echotexture is heterogeneous. The surface contour remains smooth. There is no focal mass nor ductal dilation. Portal vein is patent on color Doppler imaging with normal  direction of blood flow towards the liver. IMPRESSION: Gallstones with mild gallbladder wall thickening and pericholecystic fluid. This may reflect subacute or chronic cholecystitis associated with gallstones. No positive sonographic Murphy's sign. Increased hepatic echotexture most compatible with fatty infiltrative changes. Electronically Signed   By: Tunis Gentle  [place] M.D.   On: 05/19/2018 15:17    Catarina Hartshorn, DO  [facility] Pager [phone]  If 7PM-7AM, please contact night-coverage [email or web address] Password [email or web address] 05/19/2018, 5:15 PM   LOS: 1 day

## 2018-05-19 NOTE — Consult Note (Addendum)
Referring Provider: Dr. Arbutus Leas  Primary Care Physician:  Oval Linsey, MD Primary Gastroenterologist:  Dr. Darrick Penna   Date of Admission: 05/18/18 Date of Consultation: 05/19/18  Reason for Consultation:  ETOH abuse, anemia  HPI:  Kirk Harper is a 57 y.o. year old male admitted with acute heart failure, mildly elevated troponin in setting of heart failure, COPD exacerbation, history of ETOH abuse. Cardiology consulted and following. GI consultation requested due to anemia and heme positive stool. Hgb 9.5 on admission, unknown recent baseline; however, 2 years ago was 15 range.   Known history of fatty liver, last abdominal imaging in epic from 2014 at time of acute pancreatitis. Spleen absent. Denies overt GI bleeding. No prior colonoscopy/EGD. Declining any colonoscopy. Agreeable to EGD. Denies dysphagia. Occasional reflux. Takes Protonix once daily at home. No abdominal pain. No changes in bowel habits. Reports drinking 6-8 beers daily "for the past 40 years". No significant weight loss. No confusion or mental status changes. Does not know his baseline Hgb. Never told he was anemic. Takes Ibuprofen and Excedrin daily.   Past Medical History:  Diagnosis Date  . Alcohol abuse   . Back pain   . Bipolar affective (HCC)   . DDD (degenerative disc disease), lumbar   . GERD (gastroesophageal reflux disease)    takes tums prn  . Hypertension   . Pancreatitis   . Paranoia [facility])     Past Surgical History:  Procedure Laterality Date  . LUMBAR LAMINECTOMY/DECOMPRESSION MICRODISCECTOMY N/A 09/26/2015   Procedure: L3-4 L4-5 L5-S1 Laminectomy/Foraminotomy;  Surgeon: Hilda Lias, MD;  Location: [facility];  Service: Neurosurgery;  Laterality: N/A;  L3-4 L4-5 L5-S1 Laminectomy/Foraminotomy  . MULTIPLE EXTRACTIONS WITH ALVEOLOPLASTY N/A 06/28/2013   Procedure: MULTIPLE EXTRACTION WITH ALVEOLOPLASTY AND TORI ;  Surgeon: Georgia Lopes, DDS;  Location: [facility] OR;  Service: Oral Surgery;  Laterality:  N/A;  . spleen  spleen removed  . SPLENECTOMY      Prior to Admission medications   Medication Sig Start Date End Date Taking? Authorizing Provider  albuterol (PROVENTIL HFA;VENTOLIN HFA) 108 (90 Base) MCG/ACT inhaler Inhale 1-2 puffs into the lungs every 6 (six) hours as needed for wheezing or shortness of breath.   Yes [provider]  aspirin-acetaminophen-caffeine (EXCEDRIN EXTRA STRENGTH) [phone] MG tablet Take 2 tablets by mouth daily as needed for headache.   Yes [provider]  cetirizine (ZYRTEC) 10 MG tablet Take 10 mg by mouth daily.   Yes [provider]  chlorproMAZINE (THORAZINE) 25 MG tablet Take 25 mg by mouth 3 (three) times daily as needed.   Yes [provider]  lisinopril (PRINIVIL,ZESTRIL) 20 MG tablet Take 10 mg by mouth daily.    Yes [provider]  oxyCODONE-acetaminophen (PERCOCET/ROXICET) 5-325 MG tablet Take 1 tablet by mouth every 6 (six) hours as needed for severe pain.   Yes [provider]  pantoprazole (PROTONIX) 40 MG tablet Take 40 mg by mouth daily.   Yes [provider]  pravastatin (PRAVACHOL) 40 MG tablet Take 40 mg by mouth daily.   Yes [provider]  traZODone (DESYREL) 100 MG tablet Take 100 mg by mouth at bedtime as needed for sleep.   Yes [provider]  gabapentin (NEURONTIN) 100 MG capsule Take 1 capsule (100 mg total) by mouth 3 (three) times daily. Patient not taking: Reported on 02/06/2016 10/31/15   Loren Racer, MD    Current Facility-Administered Medications  Medication Dose Route Frequency Provider Last Rate Last Dose  .  0.9 %  sodium chloride infusion  250 mL Intravenous PRN Tat, Onalee Hua, MD      . acetaminophen (TYLENOL) tablet 650 mg  650 mg Oral Q4H PRN Tat, David, MD      . budesonide (PULMICORT) nebulizer solution 0.5 mg  0.5 mg Nebulization BID Tat, David, MD   0.5 mg at 05/19/18 0737  . folic acid (FOLVITE) tablet 1 mg  1 mg Oral Daily Tat,  David, MD   1 mg at 05/19/18 0853  . furosemide (LASIX) injection 40 mg  40 mg Intravenous BID Antoine Poche, MD   40 mg at 05/19/18 0906  . ipratropium-albuterol (DUONEB) 0.5-2.5 (3) MG/3ML nebulizer solution 3 mL  3 mL Nebulization Q6H Catarina Hartshorn, MD   3 mL at 05/19/18 0737  . LORazepam (ATIVAN) tablet 1 mg  1 mg Oral Q6H PRN Catarina Hartshorn, MD       Or  . LORazepam (ATIVAN) injection 1 mg  1 mg Intravenous Q6H PRN Tat, David, MD      . methylPREDNISolone sodium succinate (SOLU-MEDROL) 125 mg/2 mL injection 60 mg  60 mg Intravenous Q6H Catarina Hartshorn, MD   60 mg at 05/19/18 0609  . multivitamin with minerals tablet 1 tablet  1 tablet Oral Daily Tat, David, MD   1 tablet at 05/19/18 0853  . ondansetron (ZOFRAN) injection 4 mg  4 mg Intravenous Q6H PRN Tat, David, MD      . oxyCODONE-acetaminophen (PERCOCET/ROXICET) 5-325 MG per tablet 1 tablet  1 tablet Oral Q4H PRN Tat, David, MD   1 tablet at 05/19/18 0853   And  . oxyCODONE (Oxy IR/ROXICODONE) immediate release tablet 5 mg  5 mg Oral Q4H PRN Catarina Hartshorn, MD   5 mg at 05/19/18 0853  . pantoprazole (PROTONIX) 80 mg in sodium chloride 0.9 % 250 mL (0.32 mg/mL) infusion  8 mg/hr Intravenous Continuous Tat, David, MD 25 mL/hr at 05/19/18 1130 8 mg/hr at 05/19/18 1130  . [START ON 05/21/2018] pantoprazole (PROTONIX) injection 40 mg  40 mg Intravenous Q12H Tat, Onalee Hua, MD      . sodium chloride flush (NS) 0.9 % injection 3 mL  3 mL Intravenous Pablo Ledger, MD   3 mL at 05/18/18 2048  . sodium chloride flush (NS) 0.9 % injection 3 mL  3 mL Intravenous PRN Tat, David, MD      . thiamine (VITAMIN B-1) tablet 100 mg  100 mg Oral Daily Tat, David, MD   100 mg at 05/19/18 4098   Or  . thiamine (B-1) injection 100 mg  100 mg Intravenous Daily Tat, Onalee Hua, MD        Allergies as of 05/18/2018  . (No Known Allergies)    Family History  Problem Relation Age of Onset  . Colon cancer Neg Hx   . Colon polyps Neg Hx     Social History   Socioeconomic  History  . Marital status: Single    Spouse name: Not on file  . Number of children: Not on file  . Years of education: Not on file  . Highest education level: Not on file  Occupational History  . Not on file  Social Needs  . Financial resource strain: Not on file  . Food insecurity:    Worry: Not on file    Inability: Not on file  . Transportation needs:    Medical: Not on file    Non-medical: Not on file  Tobacco Use  . Smoking status: Current Every  Day Smoker    Packs/day: 0.50    Years: 40.00    Pack years: 20.00    Types: Cigarettes  . Smokeless tobacco: Never Used  Substance and Sexual Activity  . Alcohol use: Yes    Alcohol/week: 60.0 standard drinks    Types: 12 Cans of beer, 48 Standard drinks or equivalent per week    Comment: drinks daily, 6-8 cans per day per patient   . Drug use: No  . Sexual activity: Never  Lifestyle  . Physical activity:    Days per week: Not on file    Minutes per session: Not on file  . Stress: Not on file  Relationships  . Social connections:    Talks on phone: Not on file    Gets together: Not on file    Attends religious service: Not on file    Active member of club or organization: Not on file    Attends meetings of clubs or organizations: Not on file    Relationship status: Not on file  . Intimate partner violence:    Fear of current or ex partner: Not on file    Emotionally abused: Not on file    Physically abused: Not on file    Forced sexual activity: Not on file  Other Topics Concern  . Not on file  Social History Narrative  . Not on file    Review of Systems: Gen: see HPI  CV: Denies chest pain, heart palpitations, syncope, edema  Resp: Denies shortness of breath with rest, cough, wheezing GI: see HPI  GU : Denies urinary burning, urinary frequency, urinary incontinence.  MS: Denies joint pain,swelling, cramping Derm: Denies rash, itching, dry skin Psych: Denies depression, anxiety,confusion, or memory  loss Heme: Denies bruising, bleeding, and enlarged lymph nodes.  Physical Exam: Vital signs in last 24 hours: Temp:  [98.1 F (36.7 C)-98.5 F (36.9 C)] 98.1 F (36.7 C) (10/29 0628) Pulse Rate:  [98-109] 98 (10/29 0628) Resp:  [18-22] 18 (10/29 0628) BP: (93-107)/(56-75) 93/73 (10/29 0628) SpO2:  [93 %-96 %] 96 % (10/29 0739) Weight:  [103.5 kg] 103.5 kg (10/29 0725) Last BM Date: 05/18/18 General:   Alert,  Well-developed, no distress, ruddy complexion  Head:  Normocephalic and atraumatic. Eyes:  Sclera clear, no icterus.   Conjunctiva pink. Ears:  Normal auditory acuity. Nose:  No deformity, discharge,  or lesions. Lungs:  Clear throughout to auscultation.   Heart:  S2 S2 present without murmurs  Abdomen:  Distended but soft, +BS, query hepatomegaly Rectal:  Deferred until time of colonoscopy.   Msk:  Symmetrical without gross deformities. Normal posture. Extremities:  With pedal/ankle edema 1+ Neurologic:  Alert and  oriented x4 Psych:  Alert and cooperative. Normal mood and affect.  Intake/Output from previous day: 10/28 0701 - 10/29 0700 In: 1142 [P.O.:240; I.V.:382; IV Piggyback:520] Out: 1400 [Urine:1400] Intake/Output this shift: Total I/O In: 480 [P.O.:480] Out: -   Lab Results: Recent Labs    05/18/18 0828 05/19/18 0451  WBC 7.9 6.1  HGB 9.5* 9.1*  HCT 30.4* 29.5*  PLT 160 168   BMET Recent Labs    05/18/18 0831 05/19/18 0451  NA 130* 132*  K 3.9 5.2*  CL 100 104  CO2 19* 20*  GLUCOSE 114* 160*  BUN 21* 26*  CREATININE 0.90 1.08  CALCIUM 8.9 8.8*   LFT Recent Labs    05/18/18 0831  PROT 7.8  ALBUMIN 3.6  AST 35  ALT 17  ALKPHOS 69  BILITOT 1.6*    Studies/Results: Dg Chest 2 View  Result Date: 05/18/2018 CLINICAL DATA:  Peripheral edema, shortness of breath.  Smoker. EXAM: CHEST - 2 VIEW COMPARISON:  PA and lateral chest x-ray of February 06, 2016 FINDINGS: The lungs are well-expanded. The interstitial markings are increased  bilaterally. There are new small bilateral pleural effusions. The cardiac silhouette is mildly enlarged. The central pulmonary vascularity is prominent. The trachea is midline. The bony thorax exhibits no acute abnormality. IMPRESSION: CHF with mild interstitial edema and small bilateral pleural effusions. Underlying COPD. Electronically Signed   By: David  [place] M.D.   On: 05/18/2018 10:28    Impression: 57 year old male admitted with acute heart failure, mildly elevated troponin in setting of heart failure, COPD exacerbation, found to be heme positive with macrocytic anemia. Likely multifactorial in setting of chronic ETOH abuse, chronic disease, unknown iron studies. He does admit to Ibuprofen and excedrin daily chronically. Declining colonoscopy but willing to pursue EGD. When stable from cardiopulmonary perspective, recommend EGD prior to discharge.  History of fatty liver: with ETOH abuse. Query hepatomegaly. No recent imaging. Pursuing RUQ ultrasound. 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   Plan: RUQ ultrasound May resume diet after ultrasound completed Check anemia panel EGD with Propofol prior to discharge Will stop PPI infusion and use PPI BID before meals orally as no overt bleeding Recommend colonoscopy but patient is declining Will reassess status tomorrow morning ETOH cessation   Gelene Mink, PhD, ANP-BC [facility]      LOS: 1 day    05/19/2018, 12:46 PM  Addendum 1540: US abdomen with gallstones, fatty liver. No evidence of cirrhosis.  Gelene Mink, PhD, ANP-BC [facility]enter[facility]

## 2018-05-19 NOTE — Progress Notes (Signed)
**Note De-identified  Obfuscation** EKG complete and placed in patient chart 

## 2018-05-19 NOTE — Progress Notes (Signed)
Telemetry called and stated patient's QTc is greater than 500 (570s).  Dr. Wyline Mood notified.  Stated do not need to monitor QTc on telemetry but will order a 12 lead.

## 2018-05-19 NOTE — Progress Notes (Signed)
*  PRELIMINARY RESULTS* Echocardiogram 2D Echocardiogram has been performed.  Jeryl Columbia 05/19/2018, 10:54 AM

## 2018-05-19 NOTE — Progress Notes (Signed)
CRITICAL VALUE ALERT  Critical Value:  Troponin 0.07  Date & Time Notied:  05/19/2018  Provider Notified: Dr. Arbutus Leas  Orders Received/Actions taken: Dr. Arbutus Leas on unit to see patient.

## 2018-05-19 NOTE — Progress Notes (Signed)
Called about long QTc by tele. Manual measurement from admission EKG 490 (compute reports 507). He has a nonspecific conduction delay at baseline with QRS of which contributes to the long measurement. At this time would not be concerned about QTc, repeat EKG today.   Dominga Ferry MD

## 2018-05-19 NOTE — Progress Notes (Signed)
Progress Note  Patient Name: Kirk Harper Date of Encounter: 05/19/2018  Primary Cardiologist: Dina Rich, MD   Subjective   SOB improving.   Inpatient Medications    Scheduled Meds: . budesonide (PULMICORT) nebulizer solution  0.5 mg Nebulization BID  . folic acid  1 mg Oral Daily  . ipratropium-albuterol  3 mL Nebulization Q6H  . methylPREDNISolone (SOLU-MEDROL) injection  60 mg Intravenous Q6H  . multivitamin with minerals  1 tablet Oral Daily  . [START ON 05/21/2018] pantoprazole  40 mg Intravenous Q12H  . sodium chloride flush  3 mL Intravenous Q12H  . thiamine  100 mg Oral Daily   Or  . thiamine  100 mg Intravenous Daily   Continuous Infusions: . sodium chloride    . pantoprozole (PROTONIX) infusion 8 mg/hr (05/18/18 2356)   PRN Meds: sodium chloride, acetaminophen, LORazepam **OR** LORazepam, ondansetron (ZOFRAN) IV, oxyCODONE-acetaminophen **AND** oxyCODONE, sodium chloride flush   Vital Signs    Vitals:   05/18/18 2152 05/19/18 0628 05/19/18 0725 05/19/18 0739  BP: 104/75 93/73    Pulse: (!) 109 98    Resp: (!) 22 18    Temp: 98.5 F (36.9 C) 98.1 F (36.7 C)    TempSrc: Oral Oral    SpO2: 96% 95%  96%  Weight:   103.5 kg   Height:        Intake/Output Summary (Last 24 hours) at 05/19/2018 0848 Last data filed at 05/19/2018 1610 Gross per 24 hour  Intake 1141.98 ml  Output 1400 ml  Net -258.02 ml   Filed Weights   05/18/18 0811 05/18/18 1219 05/19/18 0725  Weight: 101.6 kg 101.6 kg 103.5 kg    Telemetry    SR and sinus tach - Personally Reviewed  ECG    na  Physical Exam   GEN: No acute distress.   Neck: No JVD Cardiac: RRR, no murmurs, rubs, or gallops.  Respiratory: Clear to auscultation bilaterally. GI: Soft, nontender, non-distended  MS: No edema; No deformity. Neuro:  Nonfocal  Psych: Normal affect   Labs    Chemistry Recent Labs  Lab 05/18/18 0831 05/19/18 0451  NA 130* 132*  K 3.9 5.2*  CL 100 104    CO2 19* 20*  GLUCOSE 114* 160*  BUN 21* 26*  CREATININE 0.90 1.08  CALCIUM 8.9 8.8*  PROT 7.8  --   ALBUMIN 3.6  --   AST 35  --   ALT 17  --   ALKPHOS 69  --   BILITOT 1.6*  --   GFRNONAA >60 >60  GFRAA >60 >60  ANIONGAP 11 8     Hematology Recent Labs  Lab 05/18/18 0828  WBC 7.9  RBC 3.03*  HGB 9.5*  HCT 30.4*  MCV 100.3*  MCH 31.4  MCHC 31.3  RDW 16.0*  PLT 160    Cardiac EnzymesNo results for input(s): TROPONINI in the last 168 hours.  Recent Labs  Lab 05/18/18 0832  TROPIPOC 0.09*     BNP Recent Labs  Lab 05/18/18 0831  BNP 679.0*     DDimer No results for input(s): DDIMER in the last 168 hours.   Radiology    Dg Chest 2 View  Result Date: 05/18/2018 CLINICAL DATA:  Peripheral edema, shortness of breath.  Smoker. EXAM: CHEST - 2 VIEW COMPARISON:  PA and lateral chest x-ray of February 06, 2016 FINDINGS: The lungs are well-expanded. The interstitial markings are increased bilaterally. There are new small bilateral pleural effusions. The cardiac silhouette  is mildly enlarged. The central pulmonary vascularity is prominent. The trachea is midline. The bony thorax exhibits no acute abnormality. IMPRESSION: CHF with mild interstitial edema and small bilateral pleural effusions. Underlying COPD. Electronically Signed   By: David  [place] M.D.   On: 05/18/2018 10:28    Cardiac Studies    Patient Profile     Kirk Harper is a 57 y.o. male with a hx of  HTN, ETOH, pancreatitis who is being seen today for the evaluation of CHF at the request of Harvie Heck, PA-C.  Assessment & Plan    1. Acute heart failure - presents with volume overload, SOB, and orthopnea. - echo pending, unclear cardiac function at this time - negative yesterday by charting, unclear accuracy. He received lasix IV 20mg  x 2 yesterday. Uptrend in Cr and BUN. Redose with IV lasix 40mg  bid today - further recs once cardiac function is known.    2. Elevated troponin - mild  in setting of CHF, repeat today to establish trend - f/u echo   3. Anemia - FOBT +, management per primary team  4. COPD exacerbation - per primary team  5. EtOH abuse - monitoring for withdrawal.     For questions or updates, please contact [facility] Please consult [email or web address] for contact info under        Signed, Dina Rich, MD  05/19/2018, 8:48 AM

## 2018-05-20 DIAGNOSIS — I5031 Acute diastolic (congestive) heart failure: Secondary | ICD-10-CM

## 2018-05-20 LAB — BASIC METABOLIC PANEL
Anion gap: 9 (ref 5–15)
Anion gap: 11 (ref 5–15)
BUN: 39 mg/dL — AB (ref 6–20)
BUN: 43 mg/dL — AB (ref 6–20)
CO2: 19 mmol/L — AB (ref 22–32)
CO2: 17 mmol/L — AB (ref 22–32)
CREATININE: 1.36 mg/dL — AB (ref 0.61–1.24)
CREATININE: 1.47 mg/dL — AB (ref 0.61–1.24)
Calcium: 8.5 mg/dL — ABNORMAL LOW (ref 8.9–10.3)
Calcium: 8.4 mg/dL — ABNORMAL LOW (ref 8.9–10.3)
Chloride: 103 mmol/L (ref 98–111)
Chloride: 102 mmol/L (ref 98–111)
GFR calc Af Amer: >60 mL/min (ref >60)
GFR calc Af Amer: 59 mL/min — ABNORMAL LOW (ref >60)
GFR calc non Af Amer: 55 mL/min — ABNORMAL LOW (ref >60)
GFR calc non Af Amer: 50 mL/min — ABNORMAL LOW (ref >60)
Glucose, Bld: 152 mg/dL — ABNORMAL HIGH (ref 70–99)
Glucose, Bld: 181 mg/dL — ABNORMAL HIGH (ref 70–99)
Potassium: 5.4 mmol/L — ABNORMAL HIGH (ref 3.5–5.1)
Potassium: 5.1 mmol/L (ref 3.5–5.1)
SODIUM: 131 mmol/L — AB (ref 135–145)
SODIUM: 130 mmol/L — AB (ref 135–145)

## 2018-05-20 MED ORDER — GUAIFENESIN ER 600 MG PO TB12
600.0000 mg | ORAL_TABLET | Freq: Two times a day (BID) | ORAL | Status: DC
  Administered 2018-05-20 – 2018-05-21 (×2): 600 mg via ORAL
  Filled 2018-05-20 (×2): qty 1

## 2018-05-20 MED ORDER — METHYLPREDNISOLONE SODIUM SUCC 40 MG IJ SOLR
40.0000 mg | Freq: Three times a day (TID) | INTRAMUSCULAR | Status: DC
  Administered 2018-05-20 – 2018-05-21 (×2): 40 mg via INTRAVENOUS
  Filled 2018-05-20 (×2): qty 1

## 2018-05-20 NOTE — Progress Notes (Signed)
Patient Demographics:    Kirk Harper, is a 57 y.o. male, DOB - 1961/02/04, WGN:[id number]  Admit date - 05/18/2018   Admitting Physician Catarina Hartshorn, MD  Outpatient Primary MD for the patient is Oval Linsey, MD  LOS - 2   Chief Complaint  Patient presents with  . Shortness of Breath        Subjective:    Kirk Harper today has no fevers, no emesis,  No chest pain, granddaughter at bedside, dyspnea persist, cough and wheezing persist,  Assessment  & Plan :    Active Problems:   Alcohol abuse   Acute respiratory failure with hypoxia (HCC)   Acute CHF (HCC)   COPD with acute exacerbation (HCC)   Tobacco abuse   Acute diastolic CHF (congestive heart failure) (HCC)   Hepatomegaly   Brief History:  57 y.o.malewith medical history ofhypertension, tobacco abuse, alcohol abuse, pancreatitis, peripheral neuropathy presenting with 3-week history of worsening shortness of breath with associated lower extremity edema and orthopnea type symptoms. The patient has had increasing dyspnea on exertion with orthopnea type symptoms over the past 3 weeks with increasing abdominal girth. Unfortunately, he continues to smoke up to 1 pack/day for at least the last 20 years. He continues to drink a sixpack of beer on a daily basis which he has done for the last 20 years. He denies any other illegal drug use. The patient is also been taking ibuprofen and Excedrin almost on a daily basis for chronic daily headache. 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 He denies any abdominal pain, dysuria, hematuria.  Cardiology and GI were consulted to assist with management.   Assessment/Plan: Acute respiratory failure with hypoxia--- -treat underlying acute CHFandCOPD exacerbation -Wean oxygen as  tolerated  HFpEF----patient with history of  Diastolic CHF, now with acute exacerbation -Cardiology consultation appreciated -Echocardiogram--EF 55-60%, no WMA, indeterminant diastolic function -Daily weights, creatinine is up to 1.47 cardiologist recommends stopping IV Lasix   COPD exacerbation-remains hypoxic, try to wean off O2, shortness of breath and wheezing persist -Continue Pulmicort Decrease iV Solu-Medrol to 40 mg every 8 hours give mucolytics bronchodilators and supplemental oxygen as ordered  Acute blood loss anemia -Started IV Protonix drip>>>po protonix -pt had Hgb 15.3 on 02/06/16 -presented with Hgb 9.5 -Consult GI--plan EGD when respiratory status improves -Likely due to combination of alcoholic gastritis and NSAID induced PUD;cannot rule out variceal bleed Advance to full liquid diet  Elevated troponin -no chest pain -cardiology consult appreciated -doubt ACs -cycle troponins--flat, cardiology does not plan further ischemic work-up at this time, echo without wall motion abnormalities and EF is preserved  Transaminasemia -due to Etoh and fatty liver -RUQ US--cholelithiasis with mild GB wall thickening with pericholecystic fluid -pt asymptomatic-->monitor clinically  Chronic pain syndrome -Continue home dose Percocet  Alcohol abuse -Alcohol withdrawal protocol with lorazepam, folic acid and thiamine as ordered  Tobacco abuse--- smoking cessation advised    Disposition Plan:   Home in 1-2 days  Family Communication:    Significant order at bedside   Consultants:  GI, cardiology  Code Status:  FULL   DVT Prophylaxis:  SCDs  Disposition/Need for in-Hospital  Stay- patient unable to be discharged at this time due to worsening renal function, dyspnea and hypoxia,   Code Status : full   Disposition Plan  : home (may need home oxygen)  DVT Prophylaxis  :    SCDs -concerns about GI bleed  Lab Results  Component Value Date   PLT 168  05/19/2018    Inpatient Medications  Scheduled Meds: . budesonide (PULMICORT) nebulizer solution  0.5 mg Nebulization BID  . folic acid  1 mg Oral Daily  . ipratropium-albuterol  3 mL Nebulization TID  . methylPREDNISolone (SOLU-MEDROL) injection  60 mg Intravenous Q6H  . multivitamin with minerals  1 tablet Oral Daily  . pantoprazole  40 mg Oral BID AC  . sodium chloride flush  3 mL Intravenous Q12H  . thiamine  100 mg Oral Daily   Or  . thiamine  100 mg Intravenous Daily   Continuous Infusions: . sodium chloride     PRN Meds:.sodium chloride, acetaminophen, LORazepam **OR** LORazepam, ondansetron (ZOFRAN) IV, oxyCODONE-acetaminophen **AND** oxyCODONE, sodium chloride flush    Anti-infectives (From admission, onward)   None        Objective:   Vitals:   05/20/18 0853 05/20/18 0854 05/20/18 1200 05/20/18 1451  BP:   110/86 102/79  Pulse:   98 97  Resp:   19 19  Temp:   97.6 F (36.4 C) 97.6 F (36.4 C)  TempSrc:   Oral Oral  SpO2: 91% 95% 93% 94%  Weight:      Height:        Wt Readings from Last 3 Encounters:  05/20/18 104.4 kg  02/06/16 82.6 kg  10/31/15 105.7 kg     Intake/Output Summary (Last 24 hours) at 05/20/2018 1453 Last data filed at 05/20/2018 1300 Gross per 24 hour  Intake 403 ml  Output 1700 ml  Net -1297 ml     Physical Exam Patient is examined daily including today on 05/20/18 , exams remain the same as of yesterday except that has changed   Gen:- Awake Alert,  In no apparent distress , able to speak in short sentences HEENT:- Sneads.AT, No sclera icterus Neck-Supple Neck,No JVD,.  Nose- [place] 2 L/min Lungs-diminished in bases with scattered wheezes and rhonchi CV- S1, S2 normal Abd-  +ve B.Sounds, Abd Soft, No tenderness,    Extremity/Skin:-Trace pitting edema, good pulses Psych-affect is appropriate, oriented x3 Neuro-no new focal deficits, no tremors   Data Review:   Micro Results No results found for this or any previous visit  (from the past 240 hour(s)).  Radiology Reports Dg Chest 2 View  Result Date: 05/18/2018 CLINICAL DATA:  Peripheral edema, shortness of breath.  Smoker. EXAM: CHEST - 2 VIEW COMPARISON:  PA and lateral chest x-ray of February 06, 2016 FINDINGS: The lungs are well-expanded. The interstitial markings are increased bilaterally. There are new small bilateral pleural effusions. The cardiac silhouette is mildly enlarged. The central pulmonary vascularity is prominent. The trachea is midline. The bony thorax exhibits no acute abnormality. IMPRESSION: CHF with mild interstitial edema and small bilateral pleural effusions. Underlying COPD. Electronically Signed   By: David  [place] M.D.   On: 05/18/2018 10:28   US Abdomen Limited Ruq  Result Date: 05/19/2018 CLINICAL DATA:  History of hepatomegaly.  Previous splenectomy. EXAM: ULTRASOUND ABDOMEN LIMITED RIGHT UPPER QUADRANT COMPARISON:  Abdominal and pelvic CT scan of April 20, 2013 FINDINGS: Gallbladder: The gallbladder is adequately distended. There are echogenic mobile stones measuring up to 9 mm in diameter. There  is mild gallbladder wall thickening to 4 mm. There is a small amount of pericholecystic fluid. There is no positive sonographic Murphy's sign. Common bile duct: Diameter: 5 mm.  No intraluminal stones or sludge are observed. Liver: The hepatic echotexture is heterogeneous. The surface contour remains smooth. There is no focal mass nor ductal dilation. Portal vein is patent on color Doppler imaging with normal direction of blood flow towards the liver. IMPRESSION: Gallstones with mild gallbladder wall thickening and pericholecystic fluid. This may reflect subacute or chronic cholecystitis associated with gallstones. No positive sonographic Murphy's sign. Increased hepatic echotexture most compatible with fatty infiltrative changes. Electronically Signed   By: David  [place] M.D.   On: 05/19/2018 15:17     CBC Recent Labs  Lab 05/18/18 0828  05/19/18 0451  WBC 7.9 6.1  HGB 9.5* 9.1*  HCT 30.4* 29.5*  PLT 160 168  MCV 100.3* 101.4*  MCH 31.4 31.3  MCHC 31.3 30.8  RDW 16.0* 15.9*  LYMPHSABS 2.9  --   MONOABS 1.6*  --   EOSABS 0.3  --   BASOSABS 0.1  --     Chemistries  Recent Labs  Lab 05/18/18 0831 05/19/18 0451 05/20/18 0500 05/20/18 1228  NA 130* 132* 131* 130*  K 3.9 5.2* 5.4* 5.1  CL 100 104 103 102  CO2 19* 20* 19* 17*  GLUCOSE 114* 160* 152* 181*  BUN 21* 26* 39* 43*  CREATININE 0.90 1.08 1.36* 1.47*  CALCIUM 8.9 8.8* 8.5* 8.4*  MG 2.3  --   --   --   AST 35  --   --   --   ALT 17  --   --   --   ALKPHOS 69  --   --   --   BILITOT 1.6*  --   --   --    ------------------------------------------------------------------------------------------------------------------ No results for input(s): CHOL, HDL, LDLCALC, TRIG, CHOLHDL, LDLDIRECT in the last 72 hours.  Lab Results  Component Value Date   HGBA1C 5.1 02/12/2013   ------------------------------------------------------------------------------------------------------------------ Recent Labs    05/18/18 0831  TSH 1.968   ------------------------------------------------------------------------------------------------------------------ Recent Labs    05/19/18 1314  VITAMINB12 418  FOLATE 35.8  FERRITIN 36  TIBC 486*  IRON 44*  RETICCTPCT 3.0    Coagulation profile No results for input(s): INR, PROTIME in the last 168 hours.  No results for input(s): DDIMER in the last 72 hours.  Cardiac Enzymes Recent Labs  Lab 05/19/18 0451  TROPONINI 0.07*   ------------------------------------------------------------------------------------------------------------------    Component Value Date/Time   BNP 679.0 (H) 05/18/2018 1610     Shon Hale M.D on 05/20/2018 at 2:53 PM  Pager---[phone] Go to [email or web address] - [email or web address] for contact info  [facility] - Office  [phone]

## 2018-05-20 NOTE — Care Management Note (Signed)
Case Management Note  Patient Details  Name: Kirk Harper MRN: [id number] Date of Birth: July 09, 1961  Subjective/Objective:    Acute respiratory failure with hypoxia. From home with significant other. Independent. Has cane and RW if needed. Uses a walking stick at times. Has inhaler pta. Had been acutely on oxygen, was on room air during assessment.  Has PCP-Dr. Janna Arch. Girl friend drives him to appointments.                Action/Plan: DC home. Will need an home O2 eval prior to DC.  Expected Discharge Date:      05/21/2018            Expected Discharge Plan:  Home/Self Care  In-House Referral:     Discharge planning Services  CM Consult  Post Acute Care Choice:    Choice offered to:     DME Arranged:    DME Agency:     HH Arranged:    HH Agency:     Status of Service:  In process, will continue to follow  If discussed at Long Length of Stay Meetings, dates discussed:    Additional Comments:  Tamre Cass, Chrystine Oiler, RN 05/20/2018, 2:17 PM

## 2018-05-20 NOTE — Progress Notes (Addendum)
Subjective:  No abdominal pain. Ate eggs and sausage gravy biscuit this morning.  Shortness of breath improving.  Refuses colonoscopy because his neighbor had a puncture colon and required colostomy.  Objective: Vital signs in last 24 hours: Temp:  [97.8 F (36.6 C)-98.2 F (36.8 C)] 98 F (36.7 C) (10/30 0600) Pulse Rate:  [99-101] 101 (10/30 0009) Resp:  [18] 18 (10/29 1548) BP: (97-112)/(67-80) 106/67 (10/30 0600) SpO2:  [94 %-97 %] 96 % (10/30 0600) Weight:  [104.4 kg] 104.4 kg (10/30 0644) Last BM Date: 05/18/18 General:   Alert,  Well-developed, well-nourished, pleasant and cooperative in NAD Head:  Normocephalic and atraumatic. Eyes:  Sclera clear, no icterus.  Abdomen:  Soft, nontender and nondistended.  Normal bowel sounds, without guarding, and without rebound.   Extremities:  Without clubbing, deformity.  1+ pitting edema. Neurologic:  Alert and  oriented x4;  grossly normal neurologically. Skin:  Intact without significant lesions or rashes. Psych:  Alert and cooperative. Normal mood and affect.  Intake/Output from previous day: 10/29 0701 - 10/30 0700 In: 1120 [P.O.:1120] Out: 700 [Urine:700] Intake/Output this shift: No intake/output data recorded.  Lab Results: CBC Recent Labs    05/18/18 0828 05/19/18 0451  WBC 7.9 6.1  HGB 9.5* 9.1*  HCT 30.4* 29.5*  MCV 100.3* 101.4*  PLT 160 168   BMET Recent Labs    05/18/18 0831 05/19/18 0451 05/20/18 0500  NA 130* 132* 131*  K 3.9 5.2* 5.4*  CL 100 104 103  CO2 19* 20* 19*  GLUCOSE 114* 160* 152*  BUN 21* 26* 39*  CREATININE 0.90 1.08 1.36*  CALCIUM 8.9 8.8* 8.5*   LFTs Recent Labs    05/18/18 0831  BILITOT 1.6*  ALKPHOS 69  AST 35  ALT 17  PROT 7.8  ALBUMIN 3.6   No results for input(s): LIPASE in the last 72 hours. PT/INR No results for input(s): LABPROT, INR in the last 72 hours.    Imaging Studies: Dg Chest 2 View  Result Date: 05/18/2018 CLINICAL DATA:  Peripheral edema, shortness  of breath.  Smoker. EXAM: CHEST - 2 VIEW COMPARISON:  PA and lateral chest x-ray of February 06, 2016 FINDINGS: The lungs are well-expanded. The interstitial markings are increased bilaterally. There are new small bilateral pleural effusions. The cardiac silhouette is mildly enlarged. The central pulmonary vascularity is prominent. The trachea is midline. The bony thorax exhibits no acute abnormality. IMPRESSION: CHF with mild interstitial edema and small bilateral pleural effusions. Underlying COPD. Electronically Signed   By: David  [place] M.D.   On: 05/18/2018 10:28   US Abdomen Limited Ruq  Result Date: 05/19/2018 CLINICAL DATA:  History of hepatomegaly.  Previous splenectomy. EXAM: ULTRASOUND ABDOMEN LIMITED RIGHT UPPER QUADRANT COMPARISON:  Abdominal and pelvic CT scan of April 20, 2013 FINDINGS: Gallbladder: The gallbladder is adequately distended. There are echogenic mobile stones measuring up to 9 mm in diameter. There is mild gallbladder wall thickening to 4 mm. There is a small amount of pericholecystic fluid. There is no positive sonographic Murphy's sign. Common bile duct: Diameter: 5 mm.  No intraluminal stones or sludge are observed. Liver: The hepatic echotexture is heterogeneous. The surface contour remains smooth. There is no focal mass nor ductal dilation. Portal vein is patent on color Doppler imaging with normal direction of blood flow towards the liver. IMPRESSION: Gallstones with mild gallbladder wall thickening and pericholecystic fluid. This may reflect subacute or chronic cholecystitis associated with gallstones. No positive sonographic Murphy's sign. Increased hepatic echotexture most  compatible with fatty infiltrative changes. Electronically Signed   By: David  [place] M.D.   On: 05/19/2018 15:17  [2 weeks]   Assessment:  57 year old male admitted with acute heart failure, mildly elevated troponin in setting of heart failure, COPD exacerbation, found to be heme positive with  macrocytic anemia.  Likely multifactorial in the setting of chronic alcohol abuse, chronic disease.  Anemia panel consistent with trend towards iron deficiency.  Uses ibuprofen and Excedrin daily chronically.  Patient declined colonoscopy but willing to pursue EGD.  Plans for EGD once stable from a cardiopulmonary standpoint.  Fatty liver: With alcohol abuse.  No evidence of cirrhosis on right upper quadrant ultrasound yesterday.  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    Plan: 1. Possible EGD tomorrow with propofol. From cardiac standpoint he has been cleared.  2. Correction of hyperkalemia per attending.  3. NPO after midnight.  4. ETOH cessation. 5. PPI BID.  6. Patient declines colonoscopy.   Leanna Battles. Dixon Boos [facility] [phone] 10/30/20199:44 AM     LOS: 2 days    Attending note:  Agree with above assessment and recommendations.  We will offer the patient a diagnostic EGD tomorrow.   The risks, benefits, limitations, alternatives and imponderables have been reviewed with the patient. Potential for esophageal dilation, biopsy, etc. have also been reviewed.  Questions have been answered. All parties agreeable. Patient reiterates to me he is not interested in having a colonoscopy (ever).  Further recommendations to follow.

## 2018-05-20 NOTE — Progress Notes (Signed)
Progress Note  Patient Name: Kirk Harper Date of Encounter: 05/20/2018  Primary Cardiologist: Dina Rich, MD   Subjective   SOB improving.   Inpatient Medications    Scheduled Meds: . budesonide (PULMICORT) nebulizer solution  0.5 mg Nebulization BID  . folic acid  1 mg Oral Daily  . ipratropium-albuterol  3 mL Nebulization TID  . methylPREDNISolone (SOLU-MEDROL) injection  60 mg Intravenous Q6H  . multivitamin with minerals  1 tablet Oral Daily  . pantoprazole  40 mg Oral BID AC  . sodium chloride flush  3 mL Intravenous Q12H  . thiamine  100 mg Oral Daily   Or  . thiamine  100 mg Intravenous Daily   Continuous Infusions: . sodium chloride     PRN Meds: sodium chloride, acetaminophen, LORazepam **OR** LORazepam, ondansetron (ZOFRAN) IV, oxyCODONE-acetaminophen **AND** oxyCODONE, sodium chloride flush   Vital Signs    Vitals:   05/19/18 1924 05/20/18 0009 05/20/18 0600 05/20/18 0644  BP:  112/80 106/67   Pulse:  (!) 101    Resp:      Temp:  98.2 F (36.8 C) 98 F (36.7 C)   TempSrc:  Oral Oral   SpO2: 94% 94% 96%   Weight:    104.4 kg  Height:        Intake/Output Summary (Last 24 hours) at 05/20/2018 0834 Last data filed at 05/20/2018 0600 Gross per 24 hour  Intake 880 ml  Output 700 ml  Net 180 ml   Filed Weights   05/18/18 1219 05/19/18 0725 05/20/18 0644  Weight: 101.6 kg 103.5 kg 104.4 kg    Telemetry    SR and RBBB - Personally Reviewed  ECG  na  Physical Exam   GEN: No acute distress.   Neck: No JVD Cardiac: RRR, no murmurs, rubs, or gallops.  Respiratory: Clear to auscultation bilaterally. GI: Soft, nontender, non-distended  MS: No edema; No deformity. Neuro:  Nonfocal  Psych: Normal affect   Labs    Chemistry Recent Labs  Lab 05/18/18 0831 05/19/18 0451 05/20/18 0500  NA 130* 132* 131*  K 3.9 5.2* 5.4*  CL 100 104 103  CO2 19* 20* 19*  GLUCOSE 114* 160* 152*  BUN 21* 26* 39*  CREATININE 0.90 1.08 1.36*    CALCIUM 8.9 8.8* 8.5*  PROT 7.8  --   --   ALBUMIN 3.6  --   --   AST 35  --   --   ALT 17  --   --   ALKPHOS 69  --   --   BILITOT 1.6*  --   --   GFRNONAA >60 >60 55*  GFRAA >60 >60 >60  ANIONGAP 11 8 9      Hematology Recent Labs  Lab 05/18/18 0828 05/19/18 0451 05/19/18 1314  WBC 7.9 6.1  --   RBC 3.03* 2.91* 2.76*  HGB 9.5* 9.1*  --   HCT 30.4* 29.5*  --   MCV 100.3* 101.4*  --   MCH 31.4 31.3  --   MCHC 31.3 30.8  --   RDW 16.0* 15.9*  --   PLT 160 168  --     Cardiac Enzymes Recent Labs  Lab 05/19/18 0451  TROPONINI 0.07*    Recent Labs  Lab 05/18/18 0832  TROPIPOC 0.09*     BNP Recent Labs  Lab 05/18/18 0831  BNP 679.0*     DDimer No results for input(s): DDIMER in the last 168 hours.   Radiology  Dg Chest 2 View  Result Date: 05/18/2018 CLINICAL DATA:  Peripheral edema, shortness of breath.  Smoker. EXAM: CHEST - 2 VIEW COMPARISON:  PA and lateral chest x-ray of February 06, 2016 FINDINGS: The lungs are well-expanded. The interstitial markings are increased bilaterally. There are new small bilateral pleural effusions. The cardiac silhouette is mildly enlarged. The central pulmonary vascularity is prominent. The trachea is midline. The bony thorax exhibits no acute abnormality. IMPRESSION: CHF with mild interstitial edema and small bilateral pleural effusions. Underlying COPD. Electronically Signed   By: David  [place] M.D.   On: 05/18/2018 10:28   US Abdomen Limited Ruq  Result Date: 05/19/2018 CLINICAL DATA:  History of hepatomegaly.  Previous splenectomy. EXAM: ULTRASOUND ABDOMEN LIMITED RIGHT UPPER QUADRANT COMPARISON:  Abdominal and pelvic CT scan of April 20, 2013 FINDINGS: Gallbladder: The gallbladder is adequately distended. There are echogenic mobile stones measuring up to 9 mm in diameter. There is mild gallbladder wall thickening to 4 mm. There is a small amount of pericholecystic fluid. There is no positive sonographic Murphy's sign.  Common bile duct: Diameter: 5 mm.  No intraluminal stones or sludge are observed. Liver: The hepatic echotexture is heterogeneous. The surface contour remains smooth. There is no focal mass nor ductal dilation. Portal vein is patent on color Doppler imaging with normal direction of blood flow towards the liver. IMPRESSION: Gallstones with mild gallbladder wall thickening and pericholecystic fluid. This may reflect subacute or chronic cholecystitis associated with gallstones. No positive sonographic Murphy's sign. Increased hepatic echotexture most compatible with fatty infiltrative changes. Electronically Signed   By: David  [place] M.D.   On: 05/19/2018 15:17    Cardiac Studies    Patient Profile     Kirk Bubb Ratliffis a 57 y.o.malewith a hx of HTN, ETOH, pancreatitiswho is being seen today for the evaluation ofCHFat the request of Harvie Heck, PA-C.  Assessment & Plan    1. Acute diastolic HF - echo this admit LVEF 55-60%, abnormal diastolic function indeterminant grade, no WMAs, mild AI - limited diuresis according to documented I/Os. Recorded weights have actually trended up. Exam has improved, symptoms have improved with diuresis.  Uptrend in Cr and BUN, we will d/c diuretics today. Potentially start oral diuretics tomorrow.   2. Elevated troponin - mild flat troponin in setting of CHF not specific for ACS, no ischemic workup planned at this time.   3. COPD exacerbation - per primary team  4. EtOH abuse - monitor for withdrawal symptoms  5. Long QT - manual QTc 465 this AM. Patient also with RBBB that will affect measurement. Reasonable parameters at this time.    Ok for GI procedures from cardiac standpoint.  For questions or updates, please contact [facility] Please consult [email or web address] for contact info under        Signed, Dina Rich, MD  05/20/2018, 8:34 AM

## 2018-05-21 ENCOUNTER — Encounter (HOSPITAL_COMMUNITY): Payer: Self-pay | Admitting: *Deleted

## 2018-05-21 ENCOUNTER — Encounter: Payer: Self-pay | Admitting: Gastroenterology

## 2018-05-21 ENCOUNTER — Inpatient Hospital Stay (HOSPITAL_COMMUNITY): Payer: Medicaid Other | Admitting: Anesthesiology

## 2018-05-21 ENCOUNTER — Encounter (HOSPITAL_COMMUNITY)
Admission: EM | Disposition: A | Discharge status: Home / Self Care | Payer: Self-pay | Source: Home / Self Care | Attending: Internal Medicine

## 2018-05-21 ENCOUNTER — Telehealth: Payer: Self-pay | Admitting: Gastroenterology

## 2018-05-21 DIAGNOSIS — R195 Other fecal abnormalities: Secondary | ICD-10-CM

## 2018-05-21 HISTORY — PX: ESOPHAGOGASTRODUODENOSCOPY (EGD) WITH PROPOFOL: SHX5813

## 2018-05-21 LAB — COMPREHENSIVE METABOLIC PANEL
ALK PHOS: 65 U/L (ref 38–126)
ALT: 26 U/L (ref 0–44)
AST: 37 U/L (ref 15–41)
Albumin: 3.7 g/dL (ref 3.5–5.0)
Anion gap: 8 (ref 5–15)
BILIRUBIN TOTAL: 1.2 mg/dL (ref 0.3–1.2)
BUN: 41 mg/dL — ABNORMAL HIGH (ref 6–20)
CALCIUM: 8.7 mg/dL — AB (ref 8.9–10.3)
CO2: 22 mmol/L (ref 22–32)
Chloride: 105 mmol/L (ref 98–111)
Creatinine, Ser: 1.28 mg/dL — ABNORMAL HIGH (ref 0.61–1.24)
GFR, EST NON AFRICAN AMERICAN: 60 mL/min — AB (ref >60)
Glucose, Bld: 134 mg/dL — ABNORMAL HIGH (ref 70–99)
Potassium: 5.2 mmol/L — ABNORMAL HIGH (ref 3.5–5.1)
Sodium: 135 mmol/L (ref 135–145)
TOTAL PROTEIN: 7.7 g/dL (ref 6.5–8.1)

## 2018-05-21 LAB — CBC WITH DIFFERENTIAL/PLATELET
ABS IMMATURE GRANULOCYTES: 0.1 10*3/uL — AB (ref 0.00–0.07)
Basophils Absolute: 0 10*3/uL (ref 0.0–0.1)
Basophils Relative: 0 %
Eosinophils Absolute: 0 10*3/uL (ref 0.0–0.5)
Eosinophils Relative: 0 %
HEMATOCRIT: 29.3 % — AB (ref 39.0–52.0)
Hemoglobin: 9.1 g/dL — ABNORMAL LOW (ref 13.0–17.0)
Immature Granulocytes: 1 %
LYMPHS ABS: 1.1 10*3/uL (ref 0.7–4.0)
Lymphocytes Relative: 10 %
MCH: 31.2 pg (ref 26.0–34.0)
MCHC: 31.1 g/dL (ref 30.0–36.0)
MCV: 100.3 fL — AB (ref 80.0–100.0)
MONO ABS: 2.1 10*3/uL — AB (ref 0.1–1.0)
MONOS PCT: 19 %
NEUTROS ABS: 7.8 10*3/uL — AB (ref 1.7–7.7)
Neutrophils Relative %: 70 %
PLATELETS: 173 10*3/uL (ref 150–400)
RBC: 2.92 MIL/uL — ABNORMAL LOW (ref 4.22–5.81)
RDW: 16 % — ABNORMAL HIGH (ref 11.5–15.5)
WBC: 11.1 10*3/uL — ABNORMAL HIGH (ref 4.0–10.5)
nRBC: 0 % (ref 0.0–0.2)

## 2018-05-21 LAB — PROTIME-INR
INR: 1.49
PROTHROMBIN TIME: 17.8 s — AB (ref 11.4–15.2)

## 2018-05-21 SURGERY — ESOPHAGOGASTRODUODENOSCOPY (EGD) WITH PROPOFOL
Anesthesia: General

## 2018-05-21 MED ORDER — HYDROMORPHONE HCL 1 MG/ML IJ SOLN: 0.2500 mg | INTRAMUSCULAR | Status: DC | PRN

## 2018-05-21 MED ORDER — LACTATED RINGERS IV SOLN
INTRAVENOUS | Status: DC | PRN
  Administered 2018-05-21: 08:00:00 via INTRAVENOUS

## 2018-05-21 MED ORDER — FOLIC ACID 1 MG PO TABS: 1.0000 mg | ORAL_TABLET | Freq: Every day | ORAL | 2 refills | Status: AC

## 2018-05-21 MED ORDER — LISINOPRIL 20 MG PO TABS: 10.0000 mg | ORAL_TABLET | Freq: Every day | ORAL | 1 refills | Status: AC

## 2018-05-21 MED ORDER — HYDROCODONE-ACETAMINOPHEN 7.5-325 MG PO TABS: 1.0000 | ORAL_TABLET | Freq: Once | ORAL | Status: DC | PRN

## 2018-05-21 MED ORDER — FUROSEMIDE 40 MG PO TABS: 40.0000 mg | ORAL_TABLET | Freq: Every day | ORAL | 4 refills | Status: AC

## 2018-05-21 MED ORDER — PROMETHAZINE HCL 25 MG/ML IJ SOLN: 6.2500 mg | INTRAMUSCULAR | Status: DC | PRN

## 2018-05-21 MED ORDER — THIAMINE HCL 100 MG PO TABS: 100.0000 mg | ORAL_TABLET | Freq: Every day | ORAL | 3 refills | Status: AC

## 2018-05-21 MED ORDER — LACTATED RINGERS IV SOLN: INTRAVENOUS | Status: DC

## 2018-05-21 MED ORDER — PROPOFOL 10 MG/ML IV BOLUS
INTRAVENOUS | Status: DC | PRN
  Administered 2018-05-21 (×2): 40 mg via INTRAVENOUS

## 2018-05-21 MED ORDER — ADULT MULTIVITAMIN W/MINERALS CH: 1.0000 | ORAL_TABLET | Freq: Every day | ORAL | 2 refills | Status: DC

## 2018-05-21 MED ORDER — KETAMINE HCL 10 MG/ML IJ SOLN
INTRAMUSCULAR | Status: AC
  Filled 2018-05-21: qty 1

## 2018-05-21 MED ORDER — TRAZODONE HCL 100 MG PO TABS: 100.0000 mg | ORAL_TABLET | Freq: Every day | ORAL | 3 refills | Status: AC

## 2018-05-21 MED ORDER — PANTOPRAZOLE SODIUM 40 MG PO TBEC: 40.0000 mg | DELAYED_RELEASE_TABLET | Freq: Two times a day (BID) | ORAL | 3 refills | Status: AC

## 2018-05-21 MED ORDER — PROPOFOL 500 MG/50ML IV EMUL
INTRAVENOUS | Status: DC | PRN
  Administered 2018-05-21: 150 ug/kg/min via INTRAVENOUS

## 2018-05-21 MED ORDER — PREDNISONE 20 MG PO TABS: 20.0000 mg | ORAL_TABLET | Freq: Every day | ORAL | 0 refills | Status: AC

## 2018-05-21 MED ORDER — SODIUM CHLORIDE 0.9% FLUSH
INTRAVENOUS | Status: AC
  Filled 2018-05-21: qty 10

## 2018-05-21 MED ORDER — GUAIFENESIN ER 600 MG PO TB12: 600.0000 mg | ORAL_TABLET | Freq: Two times a day (BID) | ORAL | 0 refills | Status: AC

## 2018-05-21 NOTE — Progress Notes (Signed)
IV removed, patient tolerated well.  Reviewed AVS with patient, who verbalized understanding.  Patient transported home by patient's wife.

## 2018-05-21 NOTE — Addendum Note (Signed)
Addendum  created 05/21/18 0957 by Franco Nones, CRNA   Charge Capture section accepted

## 2018-05-21 NOTE — Telephone Encounter (Signed)
PATIENT SCHEDULED AND LETTER SENT  °

## 2018-05-21 NOTE — Op Note (Signed)
[facility] Patient Name: Kirk Harper Procedure Date: 05/21/2018 7:57 AM MRN: [id number] Date of Birth: 1960-09-11 Attending MD: Gennette Pac , MD CSN: [id number] Age: 57 Admit Type: Inpatient Procedure:                Upper GI endoscopy Indications:              Heme positive stool Providers:                Gennette Pac, MD, Nena Polio, RN, Dyann Ruddle Referring MD:              Medicines:                Propofol per Anesthesia Complications:            No immediate complications. Estimated Blood Loss:     Estimated blood loss: none. Procedure:                Pre-Anesthesia Assessment:                           - Prior to the procedure, a History and Physical                            was performed, and patient medications and                            allergies were reviewed. The patient's tolerance of                            previous anesthesia was also reviewed. The risks                            and benefits of the procedure and the sedation                            options and risks were discussed with the patient.                            All questions were answered, and informed consent                            was obtained. Prior Anticoagulants: The patient has                            taken no previous anticoagulant or antiplatelet                            agents. ASA Grade Assessment: II - A patient with                            mild systemic disease. After reviewing the risks  and benefits, the patient was deemed in                            satisfactory condition to undergo the procedure.                           After obtaining informed consent, the endoscope was                            passed under direct vision. Throughout the                            procedure, the patient's blood pressure, pulse, and                            oxygen saturations were monitored  continuously. The                            [id number]-[id number] ([id number]) scope was introduced through the                            and advanced to the second part of duodenum. The                            upper GI endoscopy was accomplished without                            difficulty. The patient tolerated the procedure                            well. Scope In: 8:32:25 AM Scope Out: 8:34:01 AM Total Procedure Duration: 0 hours 1 minute 36 seconds  Findings:      The examined esophagus was normal.      Food (residue) was found in the entire examined stomach. Examination       incomplete      An examination of the duodenum was not performed. Impression:               - Normal esophagus.                           - Food (residue) in the stomach. Incomplete                           - No specimens collected. Moderate Sedation:      Moderate (conscious) sedation was personally administered by an       anesthesia professional. The following parameters were monitored: oxygen       saturation, heart rate, blood pressure, respiratory rate, EKG, adequacy       of pulmonary ventilation, and response to care. Recommendation:           - Patient has a contact number available for                            emergencies. The signs and symptoms of potential  delayed complications were discussed with the                            patient. Return to normal activities tomorrow.                            Written discharge instructions were provided to the                            patient.                           - Return patient to hospital ward for ongoing care.                           - Cardiac diet.                           - Continue present medications. Continue PPI BID.                            From a GI standpoint, could be discharged any time.                            We will follow him up in the office.                           - Repeat upper endoscopy at  appointment to be                            scheduled because the preparation was poor.                           - Return to GI clinic in 2-3 weeks. Procedure Code(s):        --- Professional ---                           [phone], Esophagogastroduodenoscopy, flexible,                            transoral; diagnostic, including collection of                            specimen(s) by brushing or washing, when performed                            (separate procedure) Diagnosis Code(s):        --- Professional ---                           R19.5, Other fecal abnormalities CPT copyright 2018 [employer]. All rights reserved. The codes documented in this report are preliminary and upon coder review may  be revised to meet current compliance requirements. Gerrit Friends. Rourk, MD Gennette Pac, MD 05/21/2018 8:43:28 AM This report has been signed electronically. Number of Addenda: 0

## 2018-05-21 NOTE — Progress Notes (Signed)
Progress Note  Patient Name: Kirk Harper Date of Encounter: 05/21/2018  Primary Cardiologist: Dina Rich, MD   Subjective   No complaints  Inpatient Medications    Scheduled Meds: . budesonide (PULMICORT) nebulizer solution  0.5 mg Nebulization BID  . folic acid  1 mg Oral Daily  . guaiFENesin  600 mg Oral BID  . ipratropium-albuterol  3 mL Nebulization TID  . methylPREDNISolone (SOLU-MEDROL) injection  40 mg Intravenous Q8H  . multivitamin with minerals  1 tablet Oral Daily  . pantoprazole  40 mg Oral BID AC  . sodium chloride flush  3 mL Intravenous Q12H  . thiamine  100 mg Oral Daily   Or  . thiamine  100 mg Intravenous Daily   Continuous Infusions: . sodium chloride     PRN Meds: sodium chloride, acetaminophen, LORazepam **OR** LORazepam, ondansetron (ZOFRAN) IV, oxyCODONE-acetaminophen **AND** oxyCODONE, sodium chloride flush   Vital Signs    Vitals:   05/21/18 0608 05/21/18 0720 05/21/18 0810 05/21/18 0845  BP: 107/73  128/86 103/86  Pulse: 93  96 93  Resp: 18  20 18   Temp: 98 F (36.7 C)  97.6 F (36.4 C) 97.8 F (36.6 C)  TempSrc:   Oral   SpO2: 95% 94% 92% 94%  Weight:   104.4 kg   Height:   6' (1.829 m)     Intake/Output Summary (Last 24 hours) at 05/21/2018 0957 Last data filed at 05/21/2018 0917 Gross per 24 hour  Intake 466 ml  Output 2375 ml  Net -1909 ml   Filed Weights   05/19/18 0725 05/20/18 0644 05/21/18 0810  Weight: 103.5 kg 104.4 kg 104.4 kg    Telemetry    SR - Personally Reviewed  ECG    na  Physical Exam   GEN: No acute distress.   Neck: No JVD Cardiac: RRR, no murmurs, rubs, or gallops.  Respiratory: Clear to auscultation bilaterally. GI: Soft, nontender, non-distended  MS: 1+ bilaterl LE edema; No deformity. Neuro:  Nonfocal  Psych: Normal affect   Labs    Chemistry Recent Labs  Lab 05/18/18 0831  05/20/18 0500 05/20/18 1228 05/21/18 0530  NA 130*   < > 131* 130* 135  K 3.9   < > 5.4* 5.1  5.2*  CL 100   < > 103 102 105  CO2 19*   < > 19* 17* 22  GLUCOSE 114*   < > 152* 181* 134*  BUN 21*   < > 39* 43* 41*  CREATININE 0.90   < > 1.36* 1.47* 1.28*  CALCIUM 8.9   < > 8.5* 8.4* 8.7*  PROT 7.8  --   --   --  7.7  ALBUMIN 3.6  --   --   --  3.7  AST 35  --   --   --  37  ALT 17  --   --   --  26  ALKPHOS 69  --   --   --  65  BILITOT 1.6*  --   --   --  1.2  GFRNONAA >60   < > 55* 50* 60*  GFRAA >60   < > >60 59* >60  ANIONGAP 11   < > 9 11 8    < > = values in this interval not displayed.     Hematology Recent Labs  Lab 05/18/18 0828 05/19/18 0451 05/19/18 1314 05/21/18 0530  WBC 7.9 6.1  --  11.1*  RBC 3.03* 2.91* 2.76* 2.92*  HGB 9.5* 9.1*  --  9.1*  HCT 30.4* 29.5*  --  29.3*  MCV 100.3* 101.4*  --  100.3*  MCH 31.4 31.3  --  31.2  MCHC 31.3 30.8  --  31.1  RDW 16.0* 15.9*  --  16.0*  PLT 160 168  --  173    Cardiac Enzymes Recent Labs  Lab 05/19/18 0451  TROPONINI 0.07*    Recent Labs  Lab 05/18/18 0832  TROPIPOC 0.09*     BNP Recent Labs  Lab 05/18/18 0831  BNP 679.0*     DDimer No results for input(s): DDIMER in the last 168 hours.   Radiology    US Abdomen Limited Ruq  Result Date: 05/19/2018 CLINICAL DATA:  History of hepatomegaly.  Previous splenectomy. EXAM: ULTRASOUND ABDOMEN LIMITED RIGHT UPPER QUADRANT COMPARISON:  Abdominal and pelvic CT scan of April 20, 2013 FINDINGS: Gallbladder: The gallbladder is adequately distended. There are echogenic mobile stones measuring up to 9 mm in diameter. There is mild gallbladder wall thickening to 4 mm. There is a small amount of pericholecystic fluid. There is no positive sonographic Murphy's sign. Common bile duct: Diameter: 5 mm.  No intraluminal stones or sludge are observed. Liver: The hepatic echotexture is heterogeneous. The surface contour remains smooth. There is no focal mass nor ductal dilation. Portal vein is patent on color Doppler imaging with normal direction of blood flow  towards the liver. IMPRESSION: Gallstones with mild gallbladder wall thickening and pericholecystic fluid. This may reflect subacute or chronic cholecystitis associated with gallstones. No positive sonographic Murphy's sign. Increased hepatic echotexture most compatible with fatty infiltrative changes. Electronically Signed   By: David  [place] M.D.   On: 05/19/2018 15:17    Cardiac Studies    Patient Profile     Kirk Harper a 57 y.o.malewith a hx of HTN, ETOH, pancreatitiswho is being seen today for the evaluation ofCHFat the request of Harvie Heck, PA-C.  Assessment & Plan    1. Acute diastolic HF - echo this admit LVEF 55-60%, abnormal diastolic function indeterminant grade, no WMAs, mild AI - I/Os are incomplete this admissoin. Exam and symptoms have improved. Uptrend in Cr/BUN yesterday, diuretics stopped. Trending back down today. Likely start oral lasix 40mg  daily tomorrow. Some ongoign LE edema but respiratory status has improved.    2. Elevated troponin - mild flat troponin in setting of CHF not specific for ACS, no ischemic workup planned at this time.   3. COPD exacerbation - per primary team  4. EtOH abuse - monitor for withdrawal symptoms  5. Long QT - manual QTc 465 this AM. Patient also with RBBB that will affect measurement. Reasonable parameters at this time.   Ok for discharge from cardiac standpoint. We will arrange follow up. Sign off inpatient care   [facility] will sign off.   Medication Recommendations:  Lasix 40mg  PO starting tomorrow Other recommendations (labs, testing, etc):  Recommend BMET/Mg 2 weeks after discharge.  Follow up as an outpatient:  We will arrange    For questions or updates, please contact [facility] Please consult [email or web address] for contact info under        Signed, Dina Rich, MD  05/21/2018, 9:57 AM

## 2018-05-21 NOTE — Anesthesia Postprocedure Evaluation (Signed)
Anesthesia Post Note  Patient: Kirk Harper  Procedure(s) Performed: ESOPHAGOGASTRODUODENOSCOPY (EGD) WITH PROPOFOL (N/A )  Patient location during evaluation: PACU Anesthesia Type: General Level of consciousness: awake and alert and patient cooperative Pain management: satisfactory to patient Vital Signs Assessment: post-procedure vital signs reviewed and stable Respiratory status: spontaneous breathing Cardiovascular status: stable Postop Assessment: no apparent nausea or vomiting Anesthetic complications: no     Last Vitals:  Vitals:   05/21/18 0810 05/21/18 0845  BP: 128/86 103/86  Pulse: 96 93  Resp: 20 18  Temp: 36.4 C 36.6 C  SpO2: 92% 94%    Last Pain:  Vitals:   05/21/18 0845  TempSrc:   PainSc: Asleep                 Charvis Lightner

## 2018-05-21 NOTE — Discharge Summary (Signed)
Kirk Harper, is a 57 y.o. male  DOB [birth year]  MRN [id number].  Admission date:  05/18/2018  Admitting Physician  Catarina Hartshorn, MD  Discharge Date:  05/21/2018   Primary MD  Oval Linsey, MD  Recommendations for primary care physician for things to follow:  1)You Need hospital follow-up in 2 to 3 weeks to schedule another EGD due to incomplete exam-- You have Anemia and Blood in your stool...Marland KitchenMarland KitchenMarland Kitchen Follow- up with gastroenterologist as outpatient to schedule this 2)Avoid ibuprofen/Advil/Aleve/Motrin/Goody Powders/Naproxen/BC powders/Meloxicam/Diclofenac/Indomethacin and other Nonsteroidal anti-inflammatory medications as these will make you more likely to bleed and can cause stomach ulcers, can also cause Kidney problems.  3) take Protonix 40 mg twice a day 4) complete abstinence from alcohol advised 5) you may use nicotine patch to help you quit smoking 6) take the rest of his medications including Lasix/furosemide as prescribed 7) you need a CBC/complete blood count and a CRP/liver electrolyte test within a week with your primary care doctor   Admission Diagnosis  Hypoxia [R09.02] Anemia, unspecified type [D64.9] Acute congestive heart failure, unspecified heart failure type (HCC) [I50.9]   Discharge Diagnosis  Hypoxia [R09.02] Anemia, unspecified type [D64.9] Acute congestive heart failure, unspecified heart failure type (HCC) [I50.9]    Active Problems:   Alcohol abuse   Acute respiratory failure with hypoxia (HCC)   Acute CHF (HCC)   COPD with acute exacerbation (HCC)   Tobacco abuse   Acute diastolic CHF (congestive heart failure) (HCC)   Hepatomegaly      Past Medical History:  Diagnosis Date  . Alcohol abuse   . Back pain   . Bipolar affective (HCC)   . DDD (degenerative disc disease), lumbar   . GERD (gastroesophageal reflux disease)    takes tums prn  . Hypertension   .  Pancreatitis   . Paranoia [facility]     Past Surgical History:  Procedure Laterality Date  . LUMBAR LAMINECTOMY/DECOMPRESSION MICRODISCECTOMY N/A 09/26/2015   Procedure: L3-4 L4-5 L5-S1 Laminectomy/Foraminotomy;  Surgeon: Hilda Lias, MD;  Location: [facility];  Service: Neurosurgery;  Laterality: N/A;  L3-4 L4-5 L5-S1 Laminectomy/Foraminotomy  . MULTIPLE EXTRACTIONS WITH ALVEOLOPLASTY N/A 06/28/2013   Procedure: MULTIPLE EXTRACTION WITH ALVEOLOPLASTY AND TORI ;  Surgeon: Georgia Lopes, DDS;  Location: [facility] OR;  Service: Oral Surgery;  Laterality: N/A;  . spleen  spleen removed  . SPLENECTOMY         HPI  from the history and physical done on the day of admission:    HPI:  Kirk Harper is a 57 y.o. male with medical history of hypertension, tobacco abuse, alcohol abuse, pancreatitis, peripheral neuropathy presenting with 3-week history of worsening shortness of breath with associated lower extremity edema and orthopnea type symptoms.  The patient denies any fevers, chills, vomiting, diarrhea, abdominal pain, headache, neck pain.  The patient states that he is only been taking half of his usual antihypertensive medication at home because it makes him feel weak.  He does not recall the name of the medication.  Nevertheless, the patient has had increasing dyspnea on exertion with orthopnea type symptoms over the past 3 weeks with increasing abdominal girth.  Unfortunately, he continues to smoke up to 1 pack/day for at least the last 20 years.  He continues to drink a sixpack of beer on a daily basis which he has done for the last 20 years.  He denies any other illegal drug use.  The patient is also been taking ibuprofen and Excedrin almost on a daily basis for chronic daily headache.  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  He denies any abdominal pain, dysuria, hematuria.  In the emergency  department, the patient was afebrile and hemodynamically stable.  He was hypoxic with oxygen saturation in the low 80s on room air.  The patient improved to 95% on 2 L supplement oxygen.  He was given furosemide 20 mg IV.  Cardiology was consulted.   Hospital Course:    Brief History: 57 y.o.malewith medical history ofhypertension, tobacco abuse, alcohol abuse, pancreatitis, peripheral neuropathy presenting with 3-week history of worsening shortness of breath with associated lower extremity edema and orthopnea type symptoms.The patient has had increasing dyspnea on exertion with orthopnea type symptoms over the past 3 weeks with increasing abdominal girth. Unfortunately, he continues to smoke up to 1 pack/day for at least the last 20 years. He continues to drink a sixpack of beer on a daily basis which he has done for the last 20 years. He denies any other illegal drug use. The patient is also been taking ibuprofen and Excedrin almost on a daily basis for chronic daily headache. 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 He denies any abdominal pain, dysuria, hematuria.Cardiology and GI were consulted to assist with management.   Assessment/Plan: Acute respiratory failure with hypoxia--- -much improved with treatment of acute CHFandCOPD exacerbation -Hypoxia resolved  HFpEF----patient with history of DiastolicCHF, now with acute exacerbation -Clinically much improved after IV Lasix diuresis, cardiology consultationappreciated -Echocardiogram--EF 55-60%, no WMA, indeterminant diastolic function  creatinine peaked at 1.47, discharge creatinine down to 1.28 cardiologist adjusted IV Lasix   Acute COPD exacerbation- much improved with steroids bronchodilators and mucolytics, hypoxia resolved, smoking cessation strongly encouraged  Acute blood loss anemia -Treated with IV  Protonix and transition to>>po protonix -pt had Hgb 15.3 on 02/06/16 -presented with Hgb 9.5 -Consult GI--plan EGD on 05/21/2018 without acute findings, patient did have food residue in stomach, suspect some degree of alcoholic gastritis as well as -Likely due to combination of alcoholic gastritis and NSAID induced PUD;upper GI in a couple weeks patient will need repeat EGD  Elevated troponin -no chest pain -cardiology consultappreciated -doubt ACs -Troponins are flat, but are not consistent with ACS, cardiology does not plan further ischemic work-up at this time, echo without wall motion abnormalities and EF is preserved  Transaminasemia -due to Etoh and fatty liver -RUQ US--cholelithiasis with mild GB wall thickening with pericholecystic fluid -pt asymptomatic--repeat labs with PCP as outpatient as advised  Chronic pain syndrome -Continue home dose Percocet  Alcohol abuse -No evidence of ongoing alcohol withdrawal at this time, abstinence from alcohol advised, continue multivitamin thiamine and folic acid Tobacco abuse--- smoking cessation advised, use Nicotine patch     Family Communication:  Significant order at bedside   Consultants:GI, cardiology  Code Status : full   Disposition Plan  : home  Discharge Condition: stable  Follow UP  Follow-up Information    Dyann Kief, PA-C On 05/25/2018.   Specialty:  Cardiology Why:  at 1:30 pm Contact information: [address] [place] [address] [phone]           Diet and Activity recommendation:  As advised  Discharge Instructions    Discharge Instructions    Call MD for:  difficulty breathing, headache or visual disturbances   Complete by:  As directed    Call MD for:  persistant dizziness or light-headedness   Complete by:  As directed    Call MD for:  persistant nausea and vomiting   Complete by:  As directed    Call MD for:  severe uncontrolled pain   Complete by:  As  directed    Call MD for:  temperature >100.4   Complete by:  As directed    Diet - low sodium heart healthy   Complete by:  As directed    Discharge instructions   Complete by:  As directed    1)You Need hospital follow-up in 2 to 3 weeks to schedule another EGD due to incomplete exam-- You have Anemia and Blood in your stool...Marland KitchenMarland KitchenMarland Kitchen Follow- up with gastroenterologist as outpatient to schedule this 2)Avoid ibuprofen/Advil/Aleve/Motrin/Goody Powders/Naproxen/BC powders/Meloxicam/Diclofenac/Indomethacin and other Nonsteroidal anti-inflammatory medications as these will make you more likely to bleed and can cause stomach ulcers, can also cause Kidney problems.  3) take Protonix 40 mg twice a day 4) complete abstinence from alcohol advised 5) you may use nicotine patch to help you quit smoking 6) take the rest of his medications including Lasix/furosemide as prescribed 7) you need a CBC/complete blood count and a CRP/liver electrolyte test within a week with your primary care doctor   Increase activity slowly   Complete by:  As directed         Discharge Medications     Allergies as of 05/21/2018   No Known Allergies     Medication List    STOP taking these medications   EXCEDRIN EXTRA STRENGTH 250-250-65 MG tablet Generic drug:  aspirin-acetaminophen-caffeine     TAKE these medications   albuterol 108 (90 Base) MCG/ACT inhaler Commonly known as:  PROVENTIL HFA;VENTOLIN HFA Inhale 1-2 puffs into the lungs every 6 (six) hours as needed for wheezing or shortness of breath.   cetirizine 10 MG tablet Commonly known as:  ZYRTEC Take 10 mg by mouth daily.   chlorproMAZINE 25 MG tablet Commonly known as:  THORAZINE Take 25 mg by mouth 3 (three) times daily as needed.   folic acid 1 MG tablet Commonly known as:  FOLVITE Take 1 tablet (1 mg total) by mouth daily. Start taking on:  05/22/2018   furosemide 40 MG tablet Commonly known as:  LASIX Take 1 tablet (40 mg total) by  mouth daily. For fluid   gabapentin 100 MG capsule Commonly known as:  NEURONTIN Take 1 capsule (100 mg total) by mouth 3 (three) times daily.   guaiFENesin 600 MG 12 hr tablet Commonly known as:  MUCINEX Take 1 tablet (600 mg total) by mouth 2 (two) times daily.   lisinopril 20 MG tablet Commonly known as:  PRINIVIL,ZESTRIL Take 0.5 tablets (10 mg total) by mouth daily.   multivitamin with minerals Tabs tablet Take 1 tablet by mouth daily. Start taking on:  05/22/2018   oxyCODONE-acetaminophen 5-325 MG tablet Commonly known as:  PERCOCET/ROXICET  Take 1 tablet by mouth every 6 (six) hours as needed for severe pain.   pantoprazole 40 MG tablet Commonly known as:  PROTONIX Take 1 tablet (40 mg total) by mouth 2 (two) times daily before a meal. What changed:  when to take this   pravastatin 40 MG tablet Commonly known as:  PRAVACHOL Take 40 mg by mouth daily.   predniSONE 20 MG tablet Commonly known as:  DELTASONE Take 1 tablet (20 mg total) by mouth daily with breakfast.   thiamine 100 MG tablet Take 1 tablet (100 mg total) by mouth daily. Start taking on:  05/22/2018   traZODone 100 MG tablet Commonly known as:  DESYREL Take 1 tablet (100 mg total) by mouth at bedtime. What changed:    when to take this  reasons to take this       Major procedures and Radiology Reports - PLEASE review detailed and final reports for all details, in brief -  Dg Chest 2 View  Result Date: 05/18/2018 CLINICAL DATA:  Peripheral edema, shortness of breath.  Smoker. EXAM: CHEST - 2 VIEW COMPARISON:  PA and lateral chest x-ray of February 06, 2016 FINDINGS: The lungs are well-expanded. The interstitial markings are increased bilaterally. There are new small bilateral pleural effusions. The cardiac silhouette is mildly enlarged. The central pulmonary vascularity is prominent. The trachea is midline. The bony thorax exhibits no acute abnormality. IMPRESSION: CHF with mild interstitial edema and  small bilateral pleural effusions. Underlying COPD. Electronically Signed   By: David  [place] M.D.   On: 05/18/2018 10:28   US Abdomen Limited Ruq  Result Date: 05/19/2018 CLINICAL DATA:  History of hepatomegaly.  Previous splenectomy. EXAM: ULTRASOUND ABDOMEN LIMITED RIGHT UPPER QUADRANT COMPARISON:  Abdominal and pelvic CT scan of April 20, 2013 FINDINGS: Gallbladder: The gallbladder is adequately distended. There are echogenic mobile stones measuring up to 9 mm in diameter. There is mild gallbladder wall thickening to 4 mm. There is a small amount of pericholecystic fluid. There is no positive sonographic Murphy's sign. Common bile duct: Diameter: 5 mm.  No intraluminal stones or sludge are observed. Liver: The hepatic echotexture is heterogeneous. The surface contour remains smooth. There is no focal mass nor ductal dilation. Portal vein is patent on color Doppler imaging with normal direction of blood flow towards the liver. IMPRESSION: Gallstones with mild gallbladder wall thickening and pericholecystic fluid. This may reflect subacute or chronic cholecystitis associated with gallstones. No positive sonographic Murphy's sign. Increased hepatic echotexture most compatible with fatty infiltrative changes. Electronically Signed   By: David  [place] M.D.   On: 05/19/2018 15:17    Micro Results   Today   Subjective    Ferdinando Lodge today has no new complaints, shortness of breath is resolved, no chest pains, eager to go home, tolerating oral intake well   Patient has been seen and examined prior to discharge   Objective   Blood pressure 103/86, pulse 93, temperature 97.8 F (36.6 C), resp. rate 18, height 6' (1.829 m), weight 104.4 kg, SpO2 94 %.   Intake/Output Summary (Last 24 hours) at 05/21/2018 1110 Last data filed at 05/21/2018 0917 Gross per 24 hour  Intake 463 ml  Output 1525 ml  Net -1062 ml    Exam Patient is examined daily including today on 05/22/18 , exams remain the  same as of yesterday except that has changed   Gen:- Awake Alert,  In no apparent distress , able to speak in sentences HEENT:- Bowlegs.AT, No sclera  icterus Neck-Supple Neck,No JVD,.  Lungs improving movement without significant wheezing CV- S1, S2 normal Abd-  +ve B.Sounds, Abd Soft, No tenderness,    Extremity/Skin:-Trace pitting edema, good pulses Psych-affect is appropriate, oriented x3 Neuro-no new focal deficits, no tremors   Data Review   CBC w Diff:  Lab Results  Component Value Date   WBC 11.1 (H) 05/21/2018   HGB 9.1 (L) 05/21/2018   HCT 29.3 (L) 05/21/2018   PLT 173 05/21/2018   LYMPHOPCT 10 05/21/2018   MONOPCT 19 05/21/2018   EOSPCT 0 05/21/2018   BASOPCT 0 05/21/2018    CMP:  Lab Results  Component Value Date   NA 135 05/21/2018   K 5.2 (H) 05/21/2018   CL 105 05/21/2018   CO2 22 05/21/2018   BUN 41 (H) 05/21/2018   CREATININE 1.28 (H) 05/21/2018   PROT 7.7 05/21/2018   ALBUMIN 3.7 05/21/2018   BILITOT 1.2 05/21/2018   ALKPHOS 65 05/21/2018   AST 37 05/21/2018   ALT 26 05/21/2018  .   Total Discharge time is about 33 minutes  Shon Hale M.D on 05/21/2018 at 11:10 AM  Pager---[phone]  Go to [email or web address] - password [email or web address] for contact info  [facility] - Office  [phone]

## 2018-05-21 NOTE — Progress Notes (Signed)
Patient seen in short stay.  No change.  Discussed with anesthesia.  EGD per plan this morning.

## 2018-05-21 NOTE — Transfer of Care (Signed)
Immediate Anesthesia Transfer of Care Note  Patient: Kirk Harper  Procedure(s) Performed: ESOPHAGOGASTRODUODENOSCOPY (EGD) WITH PROPOFOL (N/A )  Patient Location: PACU  Anesthesia Type:General  Level of Consciousness: awake, alert  and patient cooperative  Airway & Oxygen Therapy: Patient Spontanous Breathing and Patient connected to nasal cannula oxygen  Post-op Assessment: Report given to RN and Post -op Vital signs reviewed and stable  Post vital signs: Reviewed and stable  Last Vitals:  Vitals Value Taken Time  BP    Temp    Pulse 67 05/21/2018  8:41 AM  Resp    SpO2 85 % 05/21/2018  8:41 AM  Vitals shown include unvalidated device data.  Last Pain:  Vitals:   05/21/18 0810  TempSrc: Oral  PainSc: 0-No pain      Patients Stated Pain Goal: 0 (05/20/18 2118)  Complications: No apparent anesthesia complications

## 2018-05-21 NOTE — Anesthesia Preprocedure Evaluation (Signed)
Anesthesia Evaluation  Patient identified by MRN, date of birth, ID band Patient awake    Reviewed: Allergy & Precautions, NPO status , Patient's Chart, lab work & pertinent test results  Airway Mallampati: II  TM Distance: >3 FB Neck ROM: Full    Dental no notable dental hx. (+) Edentulous Upper, Edentulous Lower   Pulmonary COPD,  COPD inhaler, Current Smoker,    Pulmonary exam normal breath sounds clear to auscultation       Cardiovascular Exercise Tolerance: Good hypertension, Pt. on medications +CHF  Normal cardiovascular examI Rhythm:Regular Rate:Normal     Neuro/Psych PSYCHIATRIC DISORDERS Bipolar Disorder Schizophrenia negative neurological ROS     GI/Hepatic Neg liver ROS, GERD  Medicated and Controlled,  Endo/Other  negative endocrine ROS  Renal/GU negative Renal ROS  negative genitourinary   Musculoskeletal  (+) Arthritis , Osteoarthritis,    Abdominal   Peds negative pediatric ROS (+)  Hematology  (+) Blood dyscrasia, anemia ,   Anesthesia Other Findings   Reproductive/Obstetrics negative OB ROS                             Anesthesia Physical Anesthesia Plan  ASA: III  Anesthesia Plan: General   Post-op Pain Management:    Induction: Intravenous  PONV Risk Score and Plan:   Airway Management Planned: Nasal Cannula and Simple Face Mask  Additional Equipment:   Intra-op Plan:   Post-operative Plan:   Informed Consent: I have reviewed the patients History and Physical, chart, labs and discussed the procedure including the risks, benefits and alternatives for the proposed anesthesia with the patient or authorized representative who has indicated his/her understanding and acceptance.   Dental advisory given  Plan Discussed with: CRNA  Anesthesia Plan Comments:         Anesthesia Quick Evaluation

## 2018-05-21 NOTE — Telephone Encounter (Signed)
Patient needs hospital follow-up in 2 to 3 weeks to schedule another EGD due to incomplete exam.Dx: anemia, heme + stool.

## 2018-05-21 NOTE — Discharge Instructions (Signed)
1)You Need hospital follow-up in 2 to 3 weeks to schedule another EGD due to incomplete exam-- You have Anemia and Blood in your stool...Marland KitchenMarland KitchenMarland Kitchen Follow- up with gastroenterologist as outpatient to schedule this 2)Avoid ibuprofen/Advil/Aleve/Motrin/Goody Powders/Naproxen/BC powders/Meloxicam/Diclofenac/Indomethacin and other Nonsteroidal anti-inflammatory medications as these will make you more likely to bleed and can cause stomach ulcers, can also cause Kidney problems.  3) take Protonix 40 mg twice a day 4) complete abstinence from alcohol advised 5) you may use nicotine patch to help you quit smoking 6) take the rest of his medications including Lasix/furosemide as prescribed 7) you need a CBC/complete blood count and a CRP/liver electrolyte test within a week with your primary care doctor

## 2018-05-22 ENCOUNTER — Telehealth: Payer: Self-pay

## 2018-05-22 DIAGNOSIS — I5031 Acute diastolic (congestive) heart failure: Secondary | ICD-10-CM

## 2018-05-22 NOTE — Telephone Encounter (Signed)
-----   Message from Antoine Poche, MD sent at 05/21/2018 10:23 AM EDT ----- Patient needs BMET/Mg in 2 weeks   Dominga Ferry MD

## 2018-05-22 NOTE — Telephone Encounter (Signed)
Orders placed. Lab slips mailed to pt

## 2018-05-25 ENCOUNTER — Encounter: Payer: Self-pay | Admitting: Physician Assistant

## 2018-05-25 ENCOUNTER — Ambulatory Visit (INDEPENDENT_AMBULATORY_CARE_PROVIDER_SITE_OTHER): Payer: Medicaid Other | Admitting: Physician Assistant

## 2018-05-25 VITALS — BP 122/80 | HR 99 | Ht 75.0 in | Wt 221.0 lb

## 2018-05-25 DIAGNOSIS — Z720 Tobacco use: Secondary | ICD-10-CM

## 2018-05-25 DIAGNOSIS — I10 Essential (primary) hypertension: Secondary | ICD-10-CM

## 2018-05-25 DIAGNOSIS — F1010 Alcohol abuse, uncomplicated: Secondary | ICD-10-CM | POA: Diagnosis not present

## 2018-05-25 DIAGNOSIS — I5032 Chronic diastolic (congestive) heart failure: Secondary | ICD-10-CM | POA: Diagnosis not present

## 2018-05-25 DIAGNOSIS — D649 Anemia, unspecified: Secondary | ICD-10-CM

## 2018-05-25 NOTE — Patient Instructions (Signed)
Medication Instructions:  Your physician recommends that you continue on your current medications as directed. Please refer to the Current Medication list given to you today.  If you need a refill on your cardiac medications before your next appointment, please call your pharmacy.   Lab work: NONE  If you have labs (blood work) drawn today and your tests are completely normal, you will receive your results only by: Marland Kitchen MyChart Message (if you have MyChart) OR . A paper copy in the mail If you have any lab test that is abnormal or we need to change your treatment, we will call you to review the results.  Testing/Procedures: NONE   Follow-Up: At [facility], you and your health needs are our priority.  As part of our continuing mission to provide you with exceptional heart care, we have created designated Provider Care Teams.  These Care Teams include your primary Cardiologist (physician) and Advanced Practice Providers (APPs -  Physician Assistants and Nurse Practitioners) who all work together to provide you with the care you need, when you need it. You will need a follow up appointment in 2 months.  Please call our office 2 months in advance to schedule this appointment.  You may see Dina Rich, MD or one of the following Advanced Practice Providers on your designated Care Team:   Randall An, PA-C [facility]) . Jacolyn Reedy, PA-C [facility])  Any Other Special Instructions Will Be Listed Below (If Applicable). Thank you for choosing [facility]!  Your physician has requested that you regularly monitor and record your blood pressure readings at home. Please use the same machine at the same time of day to check your readings and record them to bring to your follow-up visit.  Your physician recommends that you weigh, daily, at the same time every day, and in the same amount of clothing. Please record your daily weights on the handout provided and bring  it to your next appointment.    Low-Sodium Eating Plan Sodium, which is an element that makes up salt, helps you maintain a healthy balance of fluids in your body. Too much sodium can increase your blood pressure and cause fluid and waste to be held in your body. Your health care provider or dietitian may recommend following this plan if you have high blood pressure (hypertension), kidney disease, liver disease, or heart failure. Eating less sodium can help lower your blood pressure, reduce swelling, and protect your heart, liver, and kidneys. What are tips for following this plan? General guidelines  Most people on this plan should limit their sodium intake to 1,500-2,000 mg (milligrams) of sodium each day. Reading food labels  The Nutrition Facts label lists the amount of sodium in one serving of the food. If you eat more than one serving, you must multiply the listed amount of sodium by the number of servings.  Choose foods with less than 140 mg of sodium per serving.  Avoid foods with 300 mg of sodium or more per serving. Shopping  Look for lower-sodium products, often labeled as "low-sodium" or "no salt added."  Always check the sodium content even if foods are labeled as "unsalted" or "no salt added".  Buy fresh foods. ? Avoid canned foods and premade or frozen meals. ? Avoid canned, cured, or processed meats  Buy breads that have less than 80 mg of sodium per slice. Cooking  Eat more home-cooked food and less restaurant, buffet, and fast food.  Avoid adding salt when cooking.  Use salt-free seasonings or herbs instead of table salt or sea salt. Check with your health care provider or pharmacist before using salt substitutes.  Cook with plant-based oils, such as canola, sunflower, or olive oil. Meal planning  When eating at a restaurant, ask that your food be prepared with less salt or no salt, if possible.  Avoid foods that contain MSG (monosodium glutamate). MSG is  sometimes added to [place] food, bouillon, and some canned foods. What foods are recommended? The items listed may not be a complete list. Talk with your dietitian about what dietary choices are best for you. Grains Low-sodium cereals, including oats, puffed wheat and rice, and shredded wheat. Low-sodium crackers. Unsalted rice. Unsalted pasta. Low-sodium bread. Whole-grain breads and whole-grain pasta. Vegetables Fresh or frozen vegetables. "No salt added" canned vegetables. "No salt added" tomato sauce and paste. Low-sodium or reduced-sodium tomato and vegetable juice. Fruits Fresh, frozen, or canned fruit. Fruit juice. Meats and other protein foods Fresh or frozen (no salt added) meat, poultry, seafood, and fish. Low-sodium canned tuna and salmon. Unsalted nuts. Dried peas, beans, and lentils without added salt. Unsalted canned beans. Eggs. Unsalted nut butters. Dairy Milk. Soy milk. Cheese that is naturally low in sodium, such as ricotta cheese, fresh mozzarella, or Swiss cheese Low-sodium or reduced-sodium cheese. Cream cheese. Yogurt. Fats and oils Unsalted butter. Unsalted margarine with no trans fat. Vegetable oils such as canola or olive oils. Seasonings and other foods Fresh and dried herbs and spices. Salt-free seasonings. Low-sodium mustard and ketchup. Sodium-free salad dressing. Sodium-free light mayonnaise. Fresh or refrigerated horseradish. Lemon juice. Vinegar. Homemade, reduced-sodium, or low-sodium soups. Unsalted popcorn and pretzels. Low-salt or salt-free chips. What foods are not recommended? The items listed may not be a complete list. Talk with your dietitian about what dietary choices are best for you. Grains Instant hot cereals. Bread stuffing, pancake, and biscuit mixes. Croutons. Seasoned rice or pasta mixes. Noodle soup cups. Boxed or frozen macaroni and cheese. Regular salted crackers. Self-rising flour. Vegetables Sauerkraut, pickled vegetables, and relishes.  Olives. [place] fries. Onion rings. Regular canned vegetables (not low-sodium or reduced-sodium). Regular canned tomato sauce and paste (not low-sodium or reduced-sodium). Regular tomato and vegetable juice (not low-sodium or reduced-sodium). Frozen vegetables in sauces. Meats and other protein foods Meat or fish that is salted, canned, smoked, spiced, or pickled. Bacon, ham, sausage, hotdogs, corned beef, chipped beef, packaged lunch meats, salt pork, jerky, pickled herring, anchovies, regular canned tuna, sardines, salted nuts. Dairy Processed cheese and cheese spreads. Cheese curds. Blue cheese. Feta cheese. String cheese. Regular cottage cheese. Buttermilk. Canned milk. Fats and oils Salted butter. Regular margarine. Ghee. Bacon fat. Seasonings and other foods Onion salt, garlic salt, seasoned salt, table salt, and sea salt. Canned and packaged gravies. Worcestershire sauce. Tartar sauce. Barbecue sauce. Teriyaki sauce. Soy sauce, including reduced-sodium. Steak sauce. Fish sauce. Oyster sauce. Cocktail sauce. Horseradish that you find on the shelf. Regular ketchup and mustard. Meat flavorings and tenderizers. Bouillon cubes. Hot sauce and Tabasco sauce. Premade or packaged marinades. Premade or packaged taco seasonings. Relishes. Regular salad dressings. Salsa. Potato and tortilla chips. Corn chips and puffs. Salted popcorn and pretzels. Canned or dried soups. Pizza. Frozen entrees and pot pies. Summary  Eating less sodium can help lower your blood pressure, reduce swelling, and protect your heart, liver, and kidneys.  Most people on this plan should limit their sodium intake to 1,500-2,000 mg (milligrams) of sodium each day.  Canned, boxed, and frozen foods are high in sodium.  Restaurant foods, fast foods, and pizza are also very high in sodium. You also get sodium by adding salt to food.  Try to cook at home, eat more fresh fruits and vegetables, and eat less fast food, canned, processed, or  prepared foods. This information is not intended to replace advice given to you by your health care provider. Make sure you discuss any questions you have with your health care provider. Document Released: 12/28/2001 Document Revised: 07/01/2016 Document Reviewed: 07/01/2016 Elsevier Interactive Patient Education  [employer].

## 2018-05-25 NOTE — Progress Notes (Signed)
Cardiology Office Note    Date:  05/25/2018   ID:  Kirk Harper, DOB 05/03/1961, MRN [id number]  PCP:  Oval Linsey, MD  Cardiologist: Dina Rich, MD EPS: None  Chief Complaint  Patient presents with  . Hospitalization Follow-up    History of Present Illness:  Kirk Harper is a 57 y.o. male with history of hypertension, EtOH, pancreatitis who was seen in the hospital 05/21/2018 for acute diastolic CHF which was new for him.  2D echo LVEF 55 to 60% with abnormal diastolic function indeterminate grade, no wall motion abnormalities, mild AI.  Troponins were mildly elevated but flat no ischemic work-up planned.  Did have a prolonged QT 465 as well as RBBB.  Also smokes a pack of cigarettes daily and has chronic GERD, drinks 5 beers daily.  Patient comes in today accompanied by his wife.  He is feeling much better.  His swelling is down and leg pain is gone.  He is feeling much better.  Trying to watch his salt.  Has quit drinking alcohol.  Still smoking.  Has chronic dyspnea on exertion      Past Medical History:  Diagnosis Date  . Alcohol abuse   . Back pain   . Bipolar affective (HCC)   . DDD (degenerative disc disease), lumbar   . GERD (gastroesophageal reflux disease)    takes tums prn  . Hypertension   . Pancreatitis   . Paranoia [facility])     Past Surgical History:  Procedure Laterality Date  . ESOPHAGOGASTRODUODENOSCOPY (EGD) WITH PROPOFOL N/A 05/21/2018   Procedure: ESOPHAGOGASTRODUODENOSCOPY (EGD) WITH PROPOFOL;  Surgeon: Corbin Ade, MD;  Location: [facility] ENDO [facility];  Service: Endoscopy;  Laterality: N/A;  . LUMBAR LAMINECTOMY/DECOMPRESSION MICRODISCECTOMY N/A 09/26/2015   Procedure: L3-4 L4-5 L5-S1 Laminectomy/Foraminotomy;  Surgeon: Hilda Lias, MD;  Location: [facility] NEURO ORS;  Service: Neurosurgery;  Laterality: N/A;  L3-4 L4-5 L5-S1 Laminectomy/Foraminotomy  . MULTIPLE EXTRACTIONS WITH ALVEOLOPLASTY N/A 06/28/2013   Procedure: MULTIPLE EXTRACTION WITH  ALVEOLOPLASTY AND TORI ;  Surgeon: Georgia Lopes, DDS;  Location: [facility] OR;  Service: Oral Surgery;  Laterality: N/A;  . spleen  spleen removed  . SPLENECTOMY      Current Medications: Current Meds  Medication Sig  . albuterol (PROVENTIL HFA;VENTOLIN HFA) 108 (90 Base) MCG/ACT inhaler Inhale 1-2 puffs into the lungs every 6 (six) hours as needed for wheezing or shortness of breath.  . cetirizine (ZYRTEC) 10 MG tablet Take 10 mg by mouth daily.  . chlorproMAZINE (THORAZINE) 25 MG tablet Take 25 mg by mouth 3 (three) times daily as needed.  . folic acid (FOLVITE) 1 MG tablet Take 1 tablet (1 mg total) by mouth daily.  . furosemide (LASIX) 40 MG tablet Take 1 tablet (40 mg total) by mouth daily. For fluid  . gabapentin (NEURONTIN) 100 MG capsule Take 1 capsule (100 mg total) by mouth 3 (three) times daily.  Marland Kitchen guaiFENesin (MUCINEX) 600 MG 12 hr tablet Take 1 tablet (600 mg total) by mouth 2 (two) times daily.  Marland Kitchen lisinopril (PRINIVIL,ZESTRIL) 20 MG tablet Take 0.5 tablets (10 mg total) by mouth daily.  . Multiple Vitamin (MULTIVITAMIN WITH MINERALS) TABS tablet Take 1 tablet by mouth daily.  Marland Kitchen oxyCODONE-acetaminophen (PERCOCET/ROXICET) 5-325 MG tablet Take 1 tablet by mouth every 6 (six) hours as needed for severe pain.  . pantoprazole (PROTONIX) 40 MG tablet Take 1 tablet (40 mg total) by mouth 2 (two) times daily before a meal.  . pravastatin (PRAVACHOL) 40 MG  tablet Take 40 mg by mouth daily.  . predniSONE (DELTASONE) 20 MG tablet Take 1 tablet (20 mg total) by mouth daily with breakfast.  . thiamine 100 MG tablet Take 1 tablet (100 mg total) by mouth daily.  . traZODone (DESYREL) 100 MG tablet Take 1 tablet (100 mg total) by mouth at bedtime.     Allergies:   Patient has no known allergies.   Social History   Socioeconomic History  . Marital status: Single    Spouse name: Not on file  . Number of children: Not on file  . Years of education: Not on file  . Highest education level: Not  on file  Occupational History  . Not on file  Social Needs  . Financial resource strain: Not on file  . Food insecurity:    Worry: Not on file    Inability: Not on file  . Transportation needs:    Medical: Not on file    Non-medical: Not on file  Tobacco Use  . Smoking status: Current Every Day Smoker    Packs/day: 0.25    Years: 40.00    Pack years: 10.00    Types: Cigarettes  . Smokeless tobacco: Never Used  Substance and Sexual Activity  . Alcohol use: Not Currently    Alcohol/week: 60.0 standard drinks    Types: 12 Cans of beer, 48 Standard drinks or equivalent per week    Comment: drinks daily, 6-8 cans per day per patient   . Drug use: No  . Sexual activity: Never  Lifestyle  . Physical activity:    Days per week: Not on file    Minutes per session: Not on file  . Stress: Not on file  Relationships  . Social connections:    Talks on phone: Not on file    Gets together: Not on file    Attends religious service: Not on file    Active member of club or organization: Not on file    Attends meetings of clubs or organizations: Not on file    Relationship status: Not on file  Other Topics Concern  . Not on file  Social History Narrative  . Not on file     Family History:  The patient's family history has no CAD.  Did not know his father, mother died in MVA ROS:   Please see the history of present illness.    Review of Systems  Constitution: Negative.  HENT: Negative.   Cardiovascular: Positive for dyspnea on exertion and leg swelling.  Respiratory: Negative.   Endocrine: Negative.   Hematologic/Lymphatic: Negative.   Musculoskeletal: Negative.   Gastrointestinal: Negative.   Genitourinary: Negative.   Neurological: Negative.    All other systems reviewed and are negative.   PHYSICAL EXAM:   VS:  BP 122/80   Pulse 99   Ht 6\' 3"  (1.905 m)   Wt 221 lb (100.2 kg)   PF 93 L/min   BMI 27.62 kg/m   Physical Exam  GEN: Well nourished, well developed, in no  acute distress  Neck: no JVD, carotid bruits, or masses Cardiac:RRR; no murmurs, rubs, or gallops  Respiratory: Decreased breath sounds with diffuse wheezing throughout GI: soft, nontender, nondistended, + BS Ext: +1 edema bilaterally, decreased distal pulses neuro:  Alert and Oriented x 3 Psych: euthymic mood, full affect  Wt Readings from Last 3 Encounters:  05/25/18 221 lb (100.2 kg)  05/21/18 230 lb 2.6 oz (104.4 kg)  02/06/16 182 lb (82.6 kg)  Studies/Labs Reviewed:   EKG:  EKG is not ordered today.   Recent Labs: 05/18/2018: B Natriuretic Peptide 679.0; Magnesium 2.3; TSH 1.968 05/21/2018: ALT 26; BUN 41; Creatinine, Ser 1.28; Hemoglobin 9.1; Platelets 173; Potassium 5.2; Sodium 135   Lipid Panel    Component Value Date/Time   TRIG 2883 RESULTS CONFIRMED BY MANUAL DILUTION (H) 01/31/2009 2300    Additional studies/ records that were reviewed today include:  2D echo 10/29/2019Study Conclusions   - Left ventricle: The cavity size was mildly dilated. Wall   thickness was increased in a pattern of mild LVH. Systolic   function was normal. The estimated ejection fraction was in the   range of 55% to 60%. Diastolic function is abnormal,   indeterminant grade. Wall motion was normal; there were no   regional wall motion abnormalities. - Aortic valve: Moderately calcified annulus. Trileaflet;   moderately thickened leaflets. There was mild regurgitation. Mean   gradient (S): 10 mm Hg. Valve area (VTI): 1.92 cm^2. Valve area   (Vmax): 1.92 cm^2. Valve area (Vmean): 1.81 cm^2. - Mitral valve: There was mild regurgitation. - Left atrium: The atrium was mildly dilated. - Pulmonary veins: There is systolic blunting consistent with   elevated LA pressure. - Right atrium: The atrium was mildly dilated. - Atrial septum: No defect or patent foramen ovale was identified. - Pulmonary arteries: Systolic pressure was moderately increased.   PA peak pressure: 43 mm Hg (S).        ASSESSMENT:    1. Chronic diastolic heart failure (HCC)   2. Essential hypertension   3. Tobacco abuse   4. Alcohol abuse   5. Anemia, unspecified type      PLAN:  In order of problems listed above:  Chronic diastolic CHF with recent hospitalization for such.  2D echo with normal LVEF and abnormal diastolic function with indeterminate grade diuresed in the hospital and creatinine trended up to 1.47 but back down to 1.28 at discharge-had blood work this morning.  We will continue current dose Lasix and wait to see what his creatinine is.  I have asked him to weigh himself daily and call us if 2 to 3 pound weight gain overnight or 5 pounds in a week.  2 g sodium diet given.  Follow-up with Dr. Wyline Mood in 2 months.  Essential hypertension blood pressure well controlled.  Has not taken his lisinopril yet as it makes him dizzy.  He said tilt high taken at night.  I asked his wife to take his blood pressure daily and bring a log in the next time he comes.  Tobacco abuse continues to smoke a pack per day.  Smoking cessation recommended.  EtOH has quit all alcohol  Anemia with blood in his stool advised to follow-up with GI.  Was placed on Protonix asked to abstain from alcohol and NSAIDs-to see GI in 2 weeks.  Medication Adjustments/Labs and Tests Ordered: Current medicines are reviewed at length with the patient today.  Concerns regarding medicines are outlined above.  Medication changes, Labs and Tests ordered today are listed in the Patient Instructions below. Patient Instructions  Medication Instructions:  Your physician recommends that you continue on your current medications as directed. Please refer to the Current Medication list given to you today.  If you need a refill on your cardiac medications before your next appointment, please call your pharmacy.   Lab work: NONE  If you have labs (blood work) drawn today and your tests are completely normal, you  will receive your  results only by: Marland Kitchen MyChart Message (if you have MyChart) OR . A paper copy in the mail If you have any lab test that is abnormal or we need to change your treatment, we will call you to review the results.  Testing/Procedures: NONE   Follow-Up: At [facility], you and your health needs are our priority.  As part of our continuing mission to provide you with exceptional heart care, we have created designated Provider Care Teams.  These Care Teams include your primary Cardiologist (physician) and Advanced Practice Providers (APPs -  Physician Assistants and Nurse Practitioners) who all work together to provide you with the care you need, when you need it. You will need a follow up appointment in 2 months.  Please call our office 2 months in advance to schedule this appointment.  You may see Dina Rich, MD or one of the following Advanced Practice Providers on your designated Care Team:   Randall An, PA-C [facility]) . Jacolyn Reedy, PA-C [facility] Office)  Any Other Special Instructions Will Be Listed Below (If Applicable). Thank you for choosing [facility]!  Your physician has requested that you regularly monitor and record your blood pressure readings at home. Please use the same machine at the same time of day to check your readings and record them to bring to your follow-up visit.  Your physician recommends that you weigh, daily, at the same time every day, and in the same amount of clothing. Please record your daily weights on the handout provided and bring it to your next appointment.    Low-Sodium Eating Plan Sodium, which is an element that makes up salt, helps you maintain a healthy balance of fluids in your body. Too much sodium can increase your blood pressure and cause fluid and waste to be held in your body. Your health care provider or dietitian may recommend following this plan if you have high blood pressure (hypertension), kidney disease,  liver disease, or heart failure. Eating less sodium can help lower your blood pressure, reduce swelling, and protect your heart, liver, and kidneys. What are tips for following this plan? General guidelines  Most people on this plan should limit their sodium intake to 1,500-2,000 mg (milligrams) of sodium each day. Reading food labels  The Nutrition Facts label lists the amount of sodium in one serving of the food. If you eat more than one serving, you must multiply the listed amount of sodium by the number of servings.  Choose foods with less than 140 mg of sodium per serving.  Avoid foods with 300 mg of sodium or more per serving. Shopping  Look for lower-sodium products, often labeled as "low-sodium" or "no salt added."  Always check the sodium content even if foods are labeled as "unsalted" or "no salt added".  Buy fresh foods. ? Avoid canned foods and premade or frozen meals. ? Avoid canned, cured, or processed meats  Buy breads that have less than 80 mg of sodium per slice. Cooking  Eat more home-cooked food and less restaurant, buffet, and fast food.  Avoid adding salt when cooking. Use salt-free seasonings or herbs instead of table salt or sea salt. Check with your health care provider or pharmacist before using salt substitutes.  Cook with plant-based oils, such as canola, sunflower, or olive oil. Meal planning  When eating at a restaurant, ask that your food be prepared with less salt or no salt, if possible.  Avoid foods that contain MSG (  monosodium glutamate). MSG is sometimes added to [place] food, bouillon, and some canned foods. What foods are recommended? The items listed may not be a complete list. Talk with your dietitian about what dietary choices are best for you. Grains Low-sodium cereals, including oats, puffed wheat and rice, and shredded wheat. Low-sodium crackers. Unsalted rice. Unsalted pasta. Low-sodium bread. Whole-grain breads and whole-grain  pasta. Vegetables Fresh or frozen vegetables. "No salt added" canned vegetables. "No salt added" tomato sauce and paste. Low-sodium or reduced-sodium tomato and vegetable juice. Fruits Fresh, frozen, or canned fruit. Fruit juice. Meats and other protein foods Fresh or frozen (no salt added) meat, poultry, seafood, and fish. Low-sodium canned tuna and salmon. Unsalted nuts. Dried peas, beans, and lentils without added salt. Unsalted canned beans. Eggs. Unsalted nut butters. Dairy Milk. Soy milk. Cheese that is naturally low in sodium, such as ricotta cheese, fresh mozzarella, or Swiss cheese Low-sodium or reduced-sodium cheese. Cream cheese. Yogurt. Fats and oils Unsalted butter. Unsalted margarine with no trans fat. Vegetable oils such as canola or olive oils. Seasonings and other foods Fresh and dried herbs and spices. Salt-free seasonings. Low-sodium mustard and ketchup. Sodium-free salad dressing. Sodium-free light mayonnaise. Fresh or refrigerated horseradish. Lemon juice. Vinegar. Homemade, reduced-sodium, or low-sodium soups. Unsalted popcorn and pretzels. Low-salt or salt-free chips. What foods are not recommended? The items listed may not be a complete list. Talk with your dietitian about what dietary choices are best for you. Grains Instant hot cereals. Bread stuffing, pancake, and biscuit mixes. Croutons. Seasoned rice or pasta mixes. Noodle soup cups. Boxed or frozen macaroni and cheese. Regular salted crackers. Self-rising flour. Vegetables Sauerkraut, pickled vegetables, and relishes. Olives. [place] fries. Onion rings. Regular canned vegetables (not low-sodium or reduced-sodium). Regular canned tomato sauce and paste (not low-sodium or reduced-sodium). Regular tomato and vegetable juice (not low-sodium or reduced-sodium). Frozen vegetables in sauces. Meats and other protein foods Meat or fish that is salted, canned, smoked, spiced, or pickled. Bacon, ham, sausage, hotdogs, corned  beef, chipped beef, packaged lunch meats, salt pork, jerky, pickled herring, anchovies, regular canned tuna, sardines, salted nuts. Dairy Processed cheese and cheese spreads. Cheese curds. Blue cheese. Feta cheese. String cheese. Regular cottage cheese. Buttermilk. Canned milk. Fats and oils Salted butter. Regular margarine. Ghee. Bacon fat. Seasonings and other foods Onion salt, garlic salt, seasoned salt, table salt, and sea salt. Canned and packaged gravies. Worcestershire sauce. Tartar sauce. Barbecue sauce. Teriyaki sauce. Soy sauce, including reduced-sodium. Steak sauce. Fish sauce. Oyster sauce. Cocktail sauce. Horseradish that you find on the shelf. Regular ketchup and mustard. Meat flavorings and tenderizers. Bouillon cubes. Hot sauce and Tabasco sauce. Premade or packaged marinades. Premade or packaged taco seasonings. Relishes. Regular salad dressings. Salsa. Potato and tortilla chips. Corn chips and puffs. Salted popcorn and pretzels. Canned or dried soups. Pizza. Frozen entrees and pot pies. Summary  Eating less sodium can help lower your blood pressure, reduce swelling, and protect your heart, liver, and kidneys.  Most people on this plan should limit their sodium intake to 1,500-2,000 mg (milligrams) of sodium each day.  Canned, boxed, and frozen foods are high in sodium. Restaurant foods, fast foods, and pizza are also very high in sodium. You also get sodium by adding salt to food.  Try to cook at home, eat more fresh fruits and vegetables, and eat less fast food, canned, processed, or prepared foods. This information is not intended to replace advice given to you by your health care provider. Make sure you discuss any  questions you have with your health care provider. Document Released: 12/28/2001 Document Revised: 07/01/2016 Document Reviewed: 07/01/2016 Elsevier Interactive Patient Education  [address]       Signed, Jacolyn Reedy, [place]  05/25/2018 2:11 PM     [facility] [address] [place], [place], [place]  [address] Phone: [phone]; Fax: [phone]

## 2018-06-07 ENCOUNTER — Encounter (HOSPITAL_COMMUNITY): Payer: Self-pay | Admitting: Emergency Medicine

## 2018-06-07 ENCOUNTER — Inpatient Hospital Stay (HOSPITAL_COMMUNITY)
Admission: EM | Admit: 2018-06-07 | Discharge: 2018-06-21 | DRG: 309 | Disposition: E | Payer: Medicaid Other | Attending: Pulmonary Disease | Admitting: Pulmonary Disease

## 2018-06-07 ENCOUNTER — Emergency Department (HOSPITAL_COMMUNITY): Payer: Medicaid Other

## 2018-06-07 DIAGNOSIS — I5032 Chronic diastolic (congestive) heart failure: Secondary | ICD-10-CM | POA: Diagnosis present

## 2018-06-07 DIAGNOSIS — J449 Chronic obstructive pulmonary disease, unspecified: Secondary | ICD-10-CM | POA: Diagnosis present

## 2018-06-07 DIAGNOSIS — I469 Cardiac arrest, cause unspecified: Secondary | ICD-10-CM | POA: Diagnosis present

## 2018-06-07 DIAGNOSIS — I110 Hypertensive heart disease with heart failure: Secondary | ICD-10-CM | POA: Diagnosis present

## 2018-06-07 DIAGNOSIS — Z515 Encounter for palliative care: Secondary | ICD-10-CM | POA: Diagnosis not present

## 2018-06-07 DIAGNOSIS — Z66 Do not resuscitate: Secondary | ICD-10-CM | POA: Diagnosis not present

## 2018-06-07 DIAGNOSIS — I4901 Ventricular fibrillation: Secondary | ICD-10-CM | POA: Diagnosis present

## 2018-06-07 LAB — CBC WITH DIFFERENTIAL/PLATELET
ABS IMMATURE GRANULOCYTES: 1.41 10*3/uL — AB (ref 0.00–0.07)
BASOS ABS: 0.1 10*3/uL (ref 0.0–0.1)
Basophils Relative: 1 %
Eosinophils Absolute: 0.4 10*3/uL (ref 0.0–0.5)
Eosinophils Relative: 2 %
HCT: 34.4 % — ABNORMAL LOW (ref 39.0–52.0)
HEMOGLOBIN: 10.1 g/dL — AB (ref 13.0–17.0)
IMMATURE GRANULOCYTES: 8 %
LYMPHS ABS: 5.4 10*3/uL — AB (ref 0.7–4.0)
LYMPHS PCT: 30 %
MCH: 30.8 pg (ref 26.0–34.0)
MCHC: 29.4 g/dL — ABNORMAL LOW (ref 30.0–36.0)
MCV: 104.9 fL — AB (ref 80.0–100.0)
MONO ABS: 0.6 10*3/uL (ref 0.1–1.0)
Monocytes Relative: 3 %
Neutro Abs: 9.8 10*3/uL — ABNORMAL HIGH (ref 1.7–7.7)
Neutrophils Relative %: 56 %
Platelets: 183 10*3/uL (ref 150–400)
RBC: 3.28 MIL/uL — ABNORMAL LOW (ref 4.22–5.81)
RDW: 16 % — ABNORMAL HIGH (ref 11.5–15.5)
WBC: 17.6 10*3/uL — ABNORMAL HIGH (ref 4.0–10.5)
nRBC: 1 % — ABNORMAL HIGH (ref 0.0–0.2)

## 2018-06-07 LAB — I-STAT CHEM 8, ED
BUN: 8 mg/dL (ref 6–20)
CALCIUM ION: 1.15 mmol/L (ref 1.15–1.40)
CREATININE: 1.1 mg/dL (ref 0.61–1.24)
Chloride: 103 mmol/L (ref 98–111)
Glucose, Bld: 151 mg/dL — ABNORMAL HIGH (ref 70–99)
HEMATOCRIT: 36 % — AB (ref 39.0–52.0)
HEMOGLOBIN: 12.2 g/dL — AB (ref 13.0–17.0)
Potassium: 2.2 mmol/L — CL (ref 3.5–5.1)
Sodium: 141 mmol/L (ref 135–145)
TCO2: 24 mmol/L (ref 22–32)

## 2018-06-07 LAB — BASIC METABOLIC PANEL
ANION GAP: 17 — AB (ref 5–15)
BUN: 9 mg/dL (ref 6–20)
CHLORIDE: 106 mmol/L (ref 98–111)
CO2: 15 mmol/L — ABNORMAL LOW (ref 22–32)
Calcium: 8 mg/dL — ABNORMAL LOW (ref 8.9–10.3)
Creatinine, Ser: 1.15 mg/dL (ref 0.61–1.24)
GFR calc Af Amer: >60 mL/min (ref >60)
GFR calc non Af Amer: >60 mL/min (ref >60)
Glucose, Bld: 196 mg/dL — ABNORMAL HIGH (ref 70–99)
POTASSIUM: 2.6 mmol/L — AB (ref 3.5–5.1)
SODIUM: 138 mmol/L (ref 135–145)

## 2018-06-07 LAB — I-STAT TROPONIN, ED: Troponin i, poc: 0.19 ng/mL (ref 0.00–0.08)

## 2018-06-07 LAB — I-STAT CG4 LACTIC ACID, ED
Lactic Acid, Venous: 9.04 mmol/L (ref 0.5–1.9)
Lactic Acid, Venous: 11.39 mmol/L (ref 0.5–1.9)

## 2018-06-07 LAB — TROPONIN I: Troponin I: 5.09 ng/mL (ref <0.03)

## 2018-06-07 LAB — CBG MONITORING, ED: GLUCOSE-CAPILLARY: 174 mg/dL — AB (ref 70–99)

## 2018-06-07 MED ORDER — EPINEPHRINE PF 1 MG/10ML IJ SOSY
PREFILLED_SYRINGE | INTRAMUSCULAR | Status: AC
  Filled 2018-06-07: qty 50

## 2018-06-07 MED ORDER — SODIUM CHLORIDE 0.9 % IV SOLN
INTRAVENOUS | Status: AC | PRN
  Administered 2018-06-07: 999 mL/h via INTRAVENOUS

## 2018-06-07 MED ORDER — EPINEPHRINE PF 1 MG/10ML IJ SOSY
PREFILLED_SYRINGE | INTRAMUSCULAR | Status: AC | PRN
  Administered 2018-06-07: 1 mg via INTRAVENOUS

## 2018-06-07 MED ORDER — NOREPINEPHRINE 4 MG/250ML-% IV SOLN
INTRAVENOUS | Status: AC
  Filled 2018-06-07: qty 250

## 2018-06-07 MED ORDER — SODIUM CHLORIDE 0.9 % IV BOLUS
1000.0000 mL | Freq: Once | INTRAVENOUS | Status: AC
  Administered 2018-06-07: 1000 mL via INTRAVENOUS

## 2018-06-07 MED ORDER — NOREPINEPHRINE 4 MG/250ML-% IV SOLN: 2.0000 ug/min | INTRAVENOUS | Status: DC

## 2018-06-07 MED ORDER — SODIUM CHLORIDE 0.9 % IV SOLN: 250.0000 mL | INTRAVENOUS | Status: DC

## 2018-06-07 MED ORDER — ATROPINE SULFATE 1 MG/ML IJ SOLN
INTRAMUSCULAR | Status: AC | PRN
  Administered 2018-06-07: 1 mg via INTRAVENOUS

## 2018-06-07 MED ORDER — POTASSIUM CHLORIDE 10 MEQ/100ML IV SOLN
10.0000 meq | INTRAVENOUS | Status: DC
  Administered 2018-06-07: 10 meq via INTRAVENOUS
  Filled 2018-06-07: qty 100

## 2018-06-07 MED ORDER — EPINEPHRINE PF 1 MG/10ML IJ SOSY
PREFILLED_SYRINGE | INTRAMUSCULAR | Status: AC | PRN
  Administered 2018-06-07 (×4): 1 mg via INTRAVENOUS

## 2018-06-09 MED FILL — Medication: Qty: 1 | Status: AC

## 2018-06-21 NOTE — ED Notes (Addendum)
Pt still has a slight palpable pulse. Have notified MD

## 2018-06-21 NOTE — ED Provider Notes (Signed)
[facility] EMERGENCY DEPARTMENT Provider Note   CSN: [id number] Arrival date & time: 07-22-2018  1223     History   Chief Complaint Chief Complaint  Patient presents with  . Cardiac Arrest    HPI Kirk Harper is a 57 y.o. male.  HPI Patient presents by EMS after unwitnessed arrest.  Found down by significant other.  Last seen responsive roughly 5 minutes prior.  EMS called.  Noted to be pulseless in asystole.  Patient varied between PEA and V. fib in route but never regained pulses.  Total time of EMS provided CPR was 1 hour.  Received 8 rounds of epinephrine, 450 of amiodarone, and multiple attempts to electrical cardiovert.  King airway placed.  Patient with agonal breathing.  Patient is unable to contribute to history.  Level 5 caveat applies. Past Medical History:  Diagnosis Date  . Alcohol abuse   . Back pain   . Bipolar affective (HCC)   . DDD (degenerative disc disease), lumbar   . GERD (gastroesophageal reflux disease)    takes tums prn  . Hypertension   . Pancreatitis   . Paranoia [facility](HCC)     Patient Active Problem List   Diagnosis Date Noted  . Cardiac arrest (HCC) 2018-06-06  . Essential hypertension 05/25/2018  . Chronic diastolic heart failure (HCC) 05/19/2018  . Hepatomegaly   . Acute respiratory failure with hypoxia (HCC) 05/18/2018  . Acute CHF (HCC) 05/18/2018  . COPD with acute exacerbation (HCC) 05/18/2018  . Tobacco abuse 05/18/2018  . Anemia   . Lumbar stenosis with neurogenic claudication 09/26/2015  . Pancreatitis, acute 02/12/2013  . Chronic pancreatitis (HCC) 02/12/2013  . Dehydration 02/12/2013  . Schizoaffective disorder (HCC) 02/12/2013  . Alcohol abuse 02/12/2013  . Alcoholic liver disease (HCC) 02/12/2013  . Lumbar disc disease 02/12/2013  . Chigger infestation 02/12/2013    Past Surgical History:  Procedure Laterality Date  . ESOPHAGOGASTRODUODENOSCOPY (EGD) WITH PROPOFOL N/A 05/21/2018   Procedure: ESOPHAGOGASTRODUODENOSCOPY  (EGD) WITH PROPOFOL;  Surgeon: Corbin Adeourk, Robert M, MD;  Location: [facility] ENDO SUITE;  Service: Endoscopy;  Laterality: N/A;  . LUMBAR LAMINECTOMY/DECOMPRESSION MICRODISCECTOMY N/A 09/26/2015   Procedure: L3-4 L4-5 L5-S1 Laminectomy/Foraminotomy;  Surgeon: Hilda LiasErnesto Botero, MD;  Location: [facility] NEURO ORS;  Service: Neurosurgery;  Laterality: N/A;  L3-4 L4-5 L5-S1 Laminectomy/Foraminotomy  . MULTIPLE EXTRACTIONS WITH ALVEOLOPLASTY N/A 06/28/2013   Procedure: MULTIPLE EXTRACTION WITH ALVEOLOPLASTY AND TORI ;  Surgeon: Georgia LopesScott M Jensen, DDS;  Location: [facility] OR;  Service: Oral Surgery;  Laterality: N/A;  . spleen  spleen removed  . SPLENECTOMY          Home Medications    Prior to Admission medications   Medication Sig Start Date End Date Taking? Authorizing Provider  albuterol (PROVENTIL HFA;VENTOLIN HFA) 108 (90 Base) MCG/ACT inhaler Inhale 1-2 puffs into the lungs every 6 (six) hours as needed for wheezing or shortness of breath.   Yes [provider]  cetirizine (ZYRTEC) 10 MG tablet Take 10 mg by mouth daily.   Yes [provider]  chlorproMAZINE (THORAZINE) 25 MG tablet Take 25 mg by mouth 3 (three) times daily as needed.   Yes [provider]  folic acid (FOLVITE) 1 MG tablet Take 1 tablet (1 mg total) by mouth daily. 05/22/18  Yes Emokpae, Courage, MD  furosemide (LASIX) 40 MG tablet Take 1 tablet (40 mg total) by mouth daily. For fluid 05/21/18 05/21/19 Yes Emokpae, Courage, MD  gabapentin (NEURONTIN) 300 MG capsule Take 300 mg by mouth 2 (two)  times daily.   Yes [provider]  guaiFENesin (MUCINEX) 600 MG 12 hr tablet Take 1 tablet (600 mg total) by mouth 2 (two) times daily. 05/21/18  Yes Emokpae, Courage, MD  lisinopril (PRINIVIL,ZESTRIL) 20 MG tablet Take 0.5 tablets (10 mg total) by mouth daily. 05/21/18  Yes Emokpae, Courage, MD  oxyCODONE-acetaminophen (PERCOCET/ROXICET) 5-325 MG tablet Take 1 tablet by mouth every 6 (six) hours as needed for severe pain.   Yes  [provider]  pantoprazole (PROTONIX) 40 MG tablet Take 1 tablet (40 mg total) by mouth 2 (two) times daily before a meal. 05/21/18  Yes Emokpae, Courage, MD  pravastatin (PRAVACHOL) 40 MG tablet Take 40 mg by mouth daily.   Yes [provider]  thiamine 100 MG tablet Take 1 tablet (100 mg total) by mouth daily. 05/22/18  Yes Shon Hale, MD  traZODone (DESYREL) 100 MG tablet Take 1 tablet (100 mg total) by mouth at bedtime. 05/21/18  Yes Emokpae, Courage, MD  predniSONE (DELTASONE) 20 MG tablet Take 1 tablet (20 mg total) by mouth daily with breakfast. Patient not taking: Reported on 2018-06-30 05/21/18   Shon Hale, MD    Family History Family History  Problem Relation Age of Onset  . Colon cancer Neg Hx   . Colon polyps Neg Hx     Social History Social History   Tobacco Use  . Smoking status: Current Every Day Smoker    Packs/day: 0.25    Years: 40.00    Pack years: 10.00    Types: Cigarettes  . Smokeless tobacco: Never Used  Substance Use Topics  . Alcohol use: Not Currently    Alcohol/week: 60.0 standard drinks    Types: 12 Cans of beer, 48 Standard drinks or equivalent per week    Comment: drinks daily, 6-8 cans per day per patient   . Drug use: No     Allergies   Patient has no known allergies.   Review of Systems Review of Systems  Unable to perform ROS: Patient unresponsive     Physical Exam Updated Vital Signs BP (!) 87/68   Pulse 86   Temp (!) 97.2 F (36.2 C)   Resp 18   Ht 6\' 3"  (1.905 m)   Wt 100.3 kg   SpO2 91%   BMI 27.64 kg/m   Physical Exam  Constitutional: He appears well-developed and well-nourished.  Chronically ill-appearing  HENT:  Head: Normocephalic.  Red blood coming from the nares and around the [facility] airway.  No obvious scalp trauma.  Eyes:  Pupils are 4 mm bilaterally and fixed  Neck: Normal range of motion. Neck supple.  Pulmonary/Chest:  Agonal respirations.  Scattered rhonchi  Abdominal:  He exhibits distension.  Musculoskeletal: Normal range of motion. He exhibits no edema or tenderness.  Mottled extremities.  1+ bilateral lower extremity pitting edema.  Neurological:  GCS of 3  Skin: No rash noted. No erythema.  Mottled and cyanotic  Nursing note and vitals reviewed.    ED Treatments / Results  Labs (all labs ordered are listed, but only abnormal results are displayed) Labs Reviewed  CBC WITH DIFFERENTIAL/PLATELET - Abnormal; Notable for the following components:      Result Value   WBC 17.6 (*)    RBC 3.28 (*)    Hemoglobin 10.1 (*)    HCT 34.4 (*)    MCV 104.9 (*)    MCHC 29.4 (*)    RDW 16.0 (*)    nRBC 1.0 (*)  Neutro Abs 9.8 (*)    Lymphs Abs 5.4 (*)    Abs Immature Granulocytes 1.41 (*)    All other components within normal limits  BASIC METABOLIC PANEL - Abnormal; Notable for the following components:   Potassium 2.6 (*)    CO2 15 (*)    Glucose, Bld 196 (*)    Calcium 8.0 (*)    Anion gap 17 (*)    All other components within normal limits  I-STAT CHEM 8, ED - Abnormal; Notable for the following components:   Potassium 2.2 (*)    Glucose, Bld 151 (*)    Hemoglobin 12.2 (*)    HCT 36.0 (*)    All other components within normal limits  I-STAT TROPONIN, ED - Abnormal; Notable for the following components:   Troponin i, poc 0.19 (*)    All other components within normal limits  I-STAT CG4 LACTIC ACID, ED - Abnormal; Notable for the following components:   Lactic Acid, Venous 9.04 (*)    All other components within normal limits  I-STAT CG4 LACTIC ACID, ED - Abnormal; Notable for the following components:   Lactic Acid, Venous 11.39 (*)    All other components within normal limits  CBG MONITORING, ED - Abnormal; Notable for the following components:   Glucose-Capillary 174 (*)    All other components within normal limits  BLOOD GAS, ARTERIAL  TROPONIN I    EKG EKG Interpretation  Date/Time:  Sunday 06-20-2018 12:39:30  EST Ventricular Rate:  108 PR Interval:    QRS Duration: 203 QT Interval:  413 QTC Calculation: 554 R Axis:   112 Text Interpretation:  Sinus tachycardia with irregular rate Borderline prolonged PR interval Right atrial enlargement RBBB and LPFB Confirmed by Loren Racer ([id number]) on Jun 20, 2018 4:40:01 PM   Radiology Dg Chest Port 1 View  Result Date: Jun 20, 2018 CLINICAL DATA:  Post intubation. EXAM: PORTABLE CHEST 1 VIEW COMPARISON:  05/18/2018 FINDINGS: Endotracheal tube is 6.6 cm above the carina and at the level of the clavicle heads. Nasogastric tube extends into the abdomen. Diffuse bilateral airspace densities are most compatible with pulmonary edema. Cardiac silhouette is enlarged. Cardiac pads overlying the chest. Negative for pneumothorax. IMPRESSION: 1. Bilateral airspace disease is most compatible with pulmonary edema. 2. Support apparatuses as described. 3. Cardiomegaly. Electronically Signed   By: Richarda Overlie M.D.   On: June 20, 2018 14:49    Procedures Procedure Name: Intubation Date/Time: 06-20-18 1:00 PM Performed by: Loren Racer, MD Laryngoscope Size: Glidescope Tube size: 7.5 mm Number of attempts: 1 Placement Confirmation: ETT inserted through vocal cords under direct vision,  Positive ETCO2 and Breath sounds checked- equal and bilateral Tube secured with: ETT holder Dental Injury: Bloody posterior oropharynx  Difficulty Due To: Difficulty was anticipated      (including critical care time)  Medications Ordered in ED Medications  EPINEPHrine (ADRENALIN) 1 MG/10ML injection (has no administration in time range)  sodium chloride 0.9 % bolus 1,000 mL (1,000 mLs Intravenous New Bag/Given 20-Jun-2018 1705)  potassium chloride 10 mEq in 100 mL IVPB (10 mEq Intravenous New Bag/Given 06/20/18 1705)  0.9 %  sodium chloride infusion (has no administration in time range)  norepinephrine (LEVOPHED) 4mg  in D5W premix infusion (has no administration in time  range)  EPINEPHrine (ADRENALIN) 1 MG/10ML injection (1 mg Intravenous Given 20-Jun-2018 1236)  0.9 %  sodium chloride infusion ( Intravenous Stopped Jun 20, 2018 1332)  atropine injection (1 mg Intravenous Given June 20, 2018 1233)  EPINEPHrine (ADRENALIN) 1 MG/10ML injection (  1 mg Intravenous Given 06-23-2018 1254)    CRITICAL CARE Performed by: Loren Racer Total critical care time: 90 minutes Critical care time was exclusive of separately billable procedures and treating other patients. Critical care was necessary to treat or prevent imminent or life-threatening deterioration. Critical care was time spent personally by me on the following activities: development of treatment plan with patient and/or surrogate as well as nursing, discussions with consultants, evaluation of patient's response to treatment, examination of patient, obtaining history from patient or surrogate, ordering and performing treatments and interventions, ordering and review of laboratory studies, ordering and review of radiographic studies, pulse oximetry and re-evaluation of patient's condition. Initial Impression / Assessment and Plan / ED Course  I have reviewed the triage vital signs and the nursing notes.  Pertinent labs & imaging results that were available during my care of the patient were reviewed by me and considered in my medical decision making (see chart for details).    Patient was then ventricular fibrillation on arrival.  CPR continued and electrical cardioversion to PEA arrest .Brooke Dare airway was exchanged for ET tube.Continued epinephrine every 3 minutes.  Given dose of atropine.  Patient had return of pulses for a brief period.  EKG during that time did demonstrate some ST segment elevation especially in lead III.  Discussed with Dr. Katrinka Blazing, cardiology interventionalists on-call.  He reviewed patient's EKG.  Given prolonged downtime, felt poor candidate for Cath Lab.  Advised medical management and attempt to stabilize.   Patient then became bradycardic and subsequently lost pulses.  CPR resumed.  Patient regained pulses with dose of epinephrine.  Given prolonged downtime greater than 90 min, pupils being fixed and dilated, further resuscitative efforts deemed futile.  Discussed with patient's significant other.  Will treat with comfort care and family allowed to patient's bedside.    Spoke with Dr. Kerry Hough who will see patient in the emergency department and admit for comfort care.  Patient sister who is closest of kin arrives emergency department.  Updated on patient's condition.  States that she wants all measures taken if his heart is still beating.  Relayed this to Dr. Kerry Hough who will discuss with the family.  Family ultimately decided they wanted to continue care for the patient and not palliative care.  Understand extremely poor prognosis.  Discussed with Dr. Kendrick Fries, intensivist at [facility].  Will accept the patient in transfer.    Just prior to transfer patient became cyanotic and lost pulses.  Patient was asystolic on monitor.  Both brother and sister at the bedside.  Agreed to stop further resuscitative efforts.  Time of death called at 5:15 PM Final Clinical Impressions(s) / ED Diagnoses   Final diagnoses:  Cardiac arrest [facility])    ED Discharge Orders    None       Loren Racer, MD 06/23/18 1720

## 2018-06-21 NOTE — ED Notes (Addendum)
Pt came in with multiple layers of clothing. Tall brown boots and 1 gold right, 1 silver ring, and 1 camouflage ring

## 2018-06-21 NOTE — Code Documentation (Signed)
No pulse palpated with rhythm check.

## 2018-06-21 NOTE — ED Triage Notes (Signed)
Pt brought in by [facility] after being found by girlfriend unresponsive on porch.  She states she walked outside with pt and he was not complaining of anything, went back in and 5 minutes later found him.  CPR initiated by police at 1130 with epi x8, amiodarone 450mg , and multiple shocks delivered by ems pta.  Pt had cpr in progress on arrival.  [place] airway placed pta.

## 2018-06-21 NOTE — ED Notes (Signed)
Pt has no urine output at this time.

## 2018-06-21 NOTE — ED Notes (Signed)
Date and time results received: 06-01-18 5:29 PM  (use smartphrase ".now" to insert current time)  Test: Troponin Critical Value: 5.09  Name of Provider Notified: Ranae PalmsYelverton  Orders Received? Or Actions Taken?: Actions Taken: none

## 2018-06-21 NOTE — Progress Notes (Signed)
[facility]  [facility]. Cardiac arrest this evening for 1 hour Now has spontaneous circulation Sister has requested "everything be done" Prognosis poor Will accept to our facility as [facility]  Heber CarolinaBrent Misty Foutz, MD [place] [facility] Pager: [phone] Cell: [phone] If no response, call [phone]

## 2018-06-21 NOTE — Code Documentation (Signed)
Pt began to brady down into 40s.  No palpable pulse felt.  CPR initiated and EPI given.

## 2018-06-21 NOTE — ED Notes (Signed)
Have notified Dr. Ranae PalmsYelverton of increasing vitals. Stated that we would add on labs and get a chest xray.

## 2018-06-21 NOTE — ED Notes (Signed)
Family now at bedside

## 2018-06-21 NOTE — ED Notes (Signed)
Have placed pt's monitor on comfort care.

## 2018-06-21 NOTE — Code Documentation (Signed)
Family updated as to patient's status.

## 2018-06-21 NOTE — Code Documentation (Signed)
After dfib, pt had bradycardic organized rhythm with pulse palpated by Dr. Ranae PalmsYelverton.  Pulse quickly faded and cpr resumed.

## 2018-06-21 NOTE — ED Notes (Signed)
Pt family at bedside

## 2018-06-21 NOTE — Code Documentation (Signed)
Defibrillated at 120J for Vfib.

## 2018-06-21 NOTE — ED Notes (Signed)
TOD called 1715 per MD Yelverton.

## 2018-06-21 NOTE — ED Notes (Signed)
Pt's clothing cut off on arrival and placed into bag.  Noted keys fell out of pocket and were replaced.  Given to girlfriend Eunice Blase(Debbie) as she asked about keys due to them being for her house.

## 2018-06-21 NOTE — ED Notes (Signed)
Pt is currently showing an organized rhythm on monitor. HR 87 with pulse. R 25

## 2018-06-21 NOTE — ED Notes (Signed)
This nurse went in to start pt on Levophed at 1713. Upon entering the room and before starting the Levophed, pt began to turn blue and brady'd down to 40s. This nurse tried to feel a pulse and could not. Code blue button pulled and compressions started. Upon entering room by Dr. Ranae PalmsYelverton, brother stated to stop compressions. TOD called at 1715

## 2018-06-21 NOTE — Code Documentation (Signed)
Pulse palpable on check with organized rhythm.

## 2018-06-21 NOTE — ED Notes (Signed)
Date and time results received: 03/29/18  1521 (use smartphrase ".now" to insert current time)  Test:Potassium Critical Value: 2.6  Name of Provider Notified: Dr Ranae PalmsYelverton  Orders Received? Or Actions Taken?: NA

## 2018-06-21 NOTE — Progress Notes (Signed)
Dr.Yelverton gave verbal order to discontinue ABG order at 1451.

## 2018-06-21 NOTE — Code Documentation (Signed)
Rhythm check shows organized rhythm with intermittent palpable pulse with runs of Vtach

## 2018-06-21 NOTE — ED Notes (Signed)
Pt was also given Narcan 2mg  by ems pta.

## 2018-06-21 NOTE — ED Notes (Signed)
Dr. Kerry HoughMemon has updated me on what is supposed to be done for pt. Will notify him once family has made a decision

## 2018-06-21 DEATH — deceased

## 2018-07-02 ENCOUNTER — Ambulatory Visit: Payer: Medicaid Other | Admitting: Gastroenterology

## 2018-07-28 ENCOUNTER — Ambulatory Visit: Payer: Medicaid Other | Admitting: Cardiology

## 2020-01-17 IMAGING — DX DG CHEST 2V
3 series · 3 of 3 positions shown · non-contrast
Comparison: PA and lateral chest x-ray February 06, 2016

CLINICAL DATA: Peripheral edema, shortness of breath.  Smoker.

EXAM:
CHEST - 2 VIEW

[chest pa (1 of 2)]
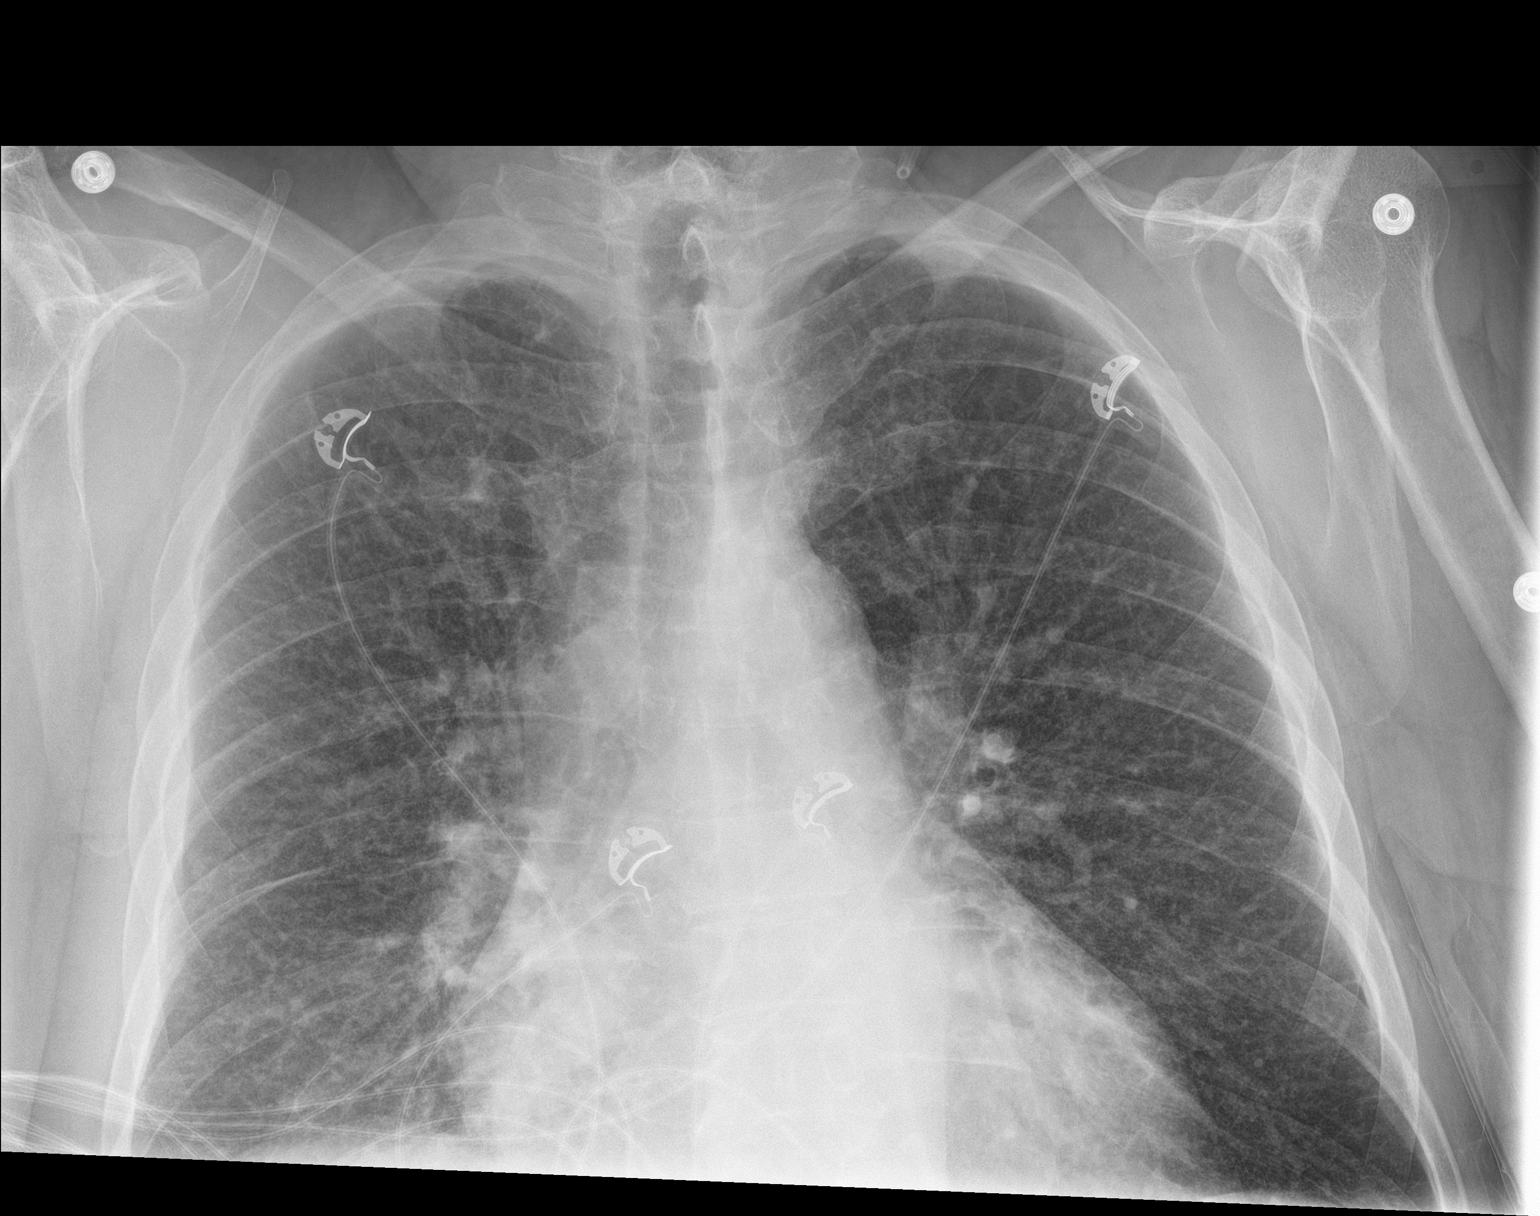

[chest lat]
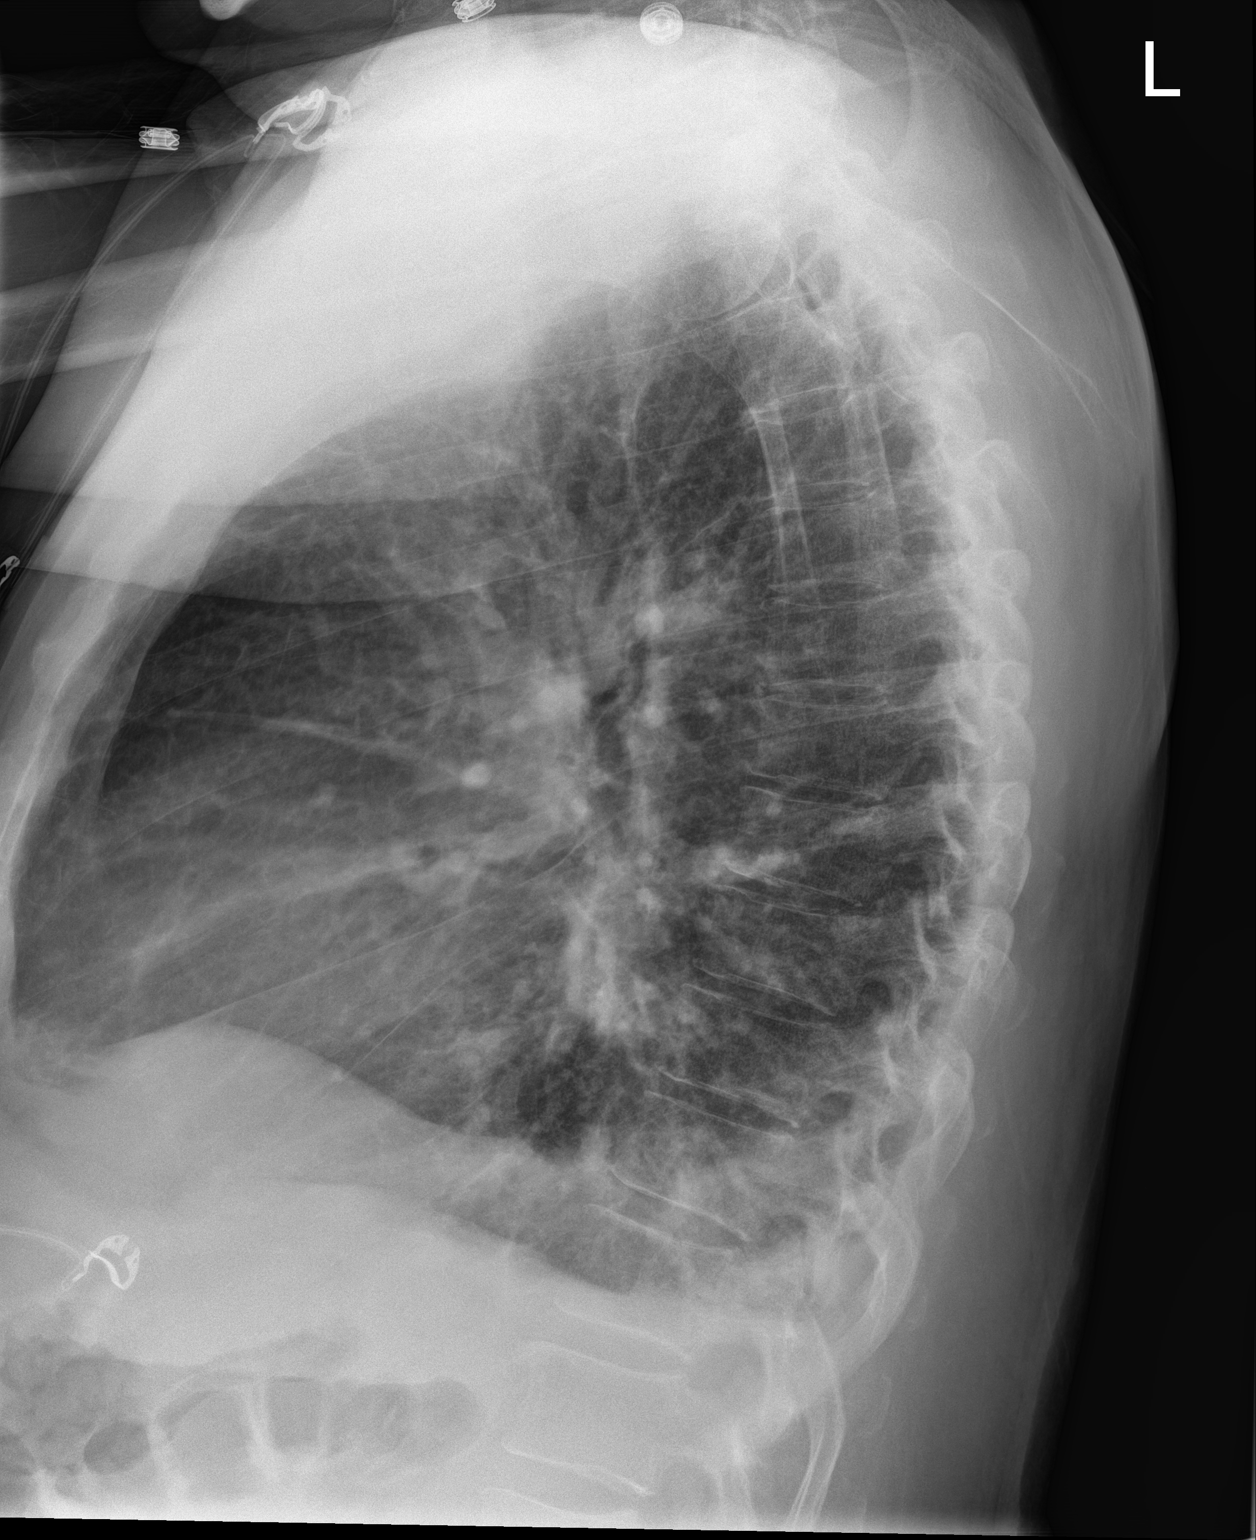

[chest pa (2 of 2)]
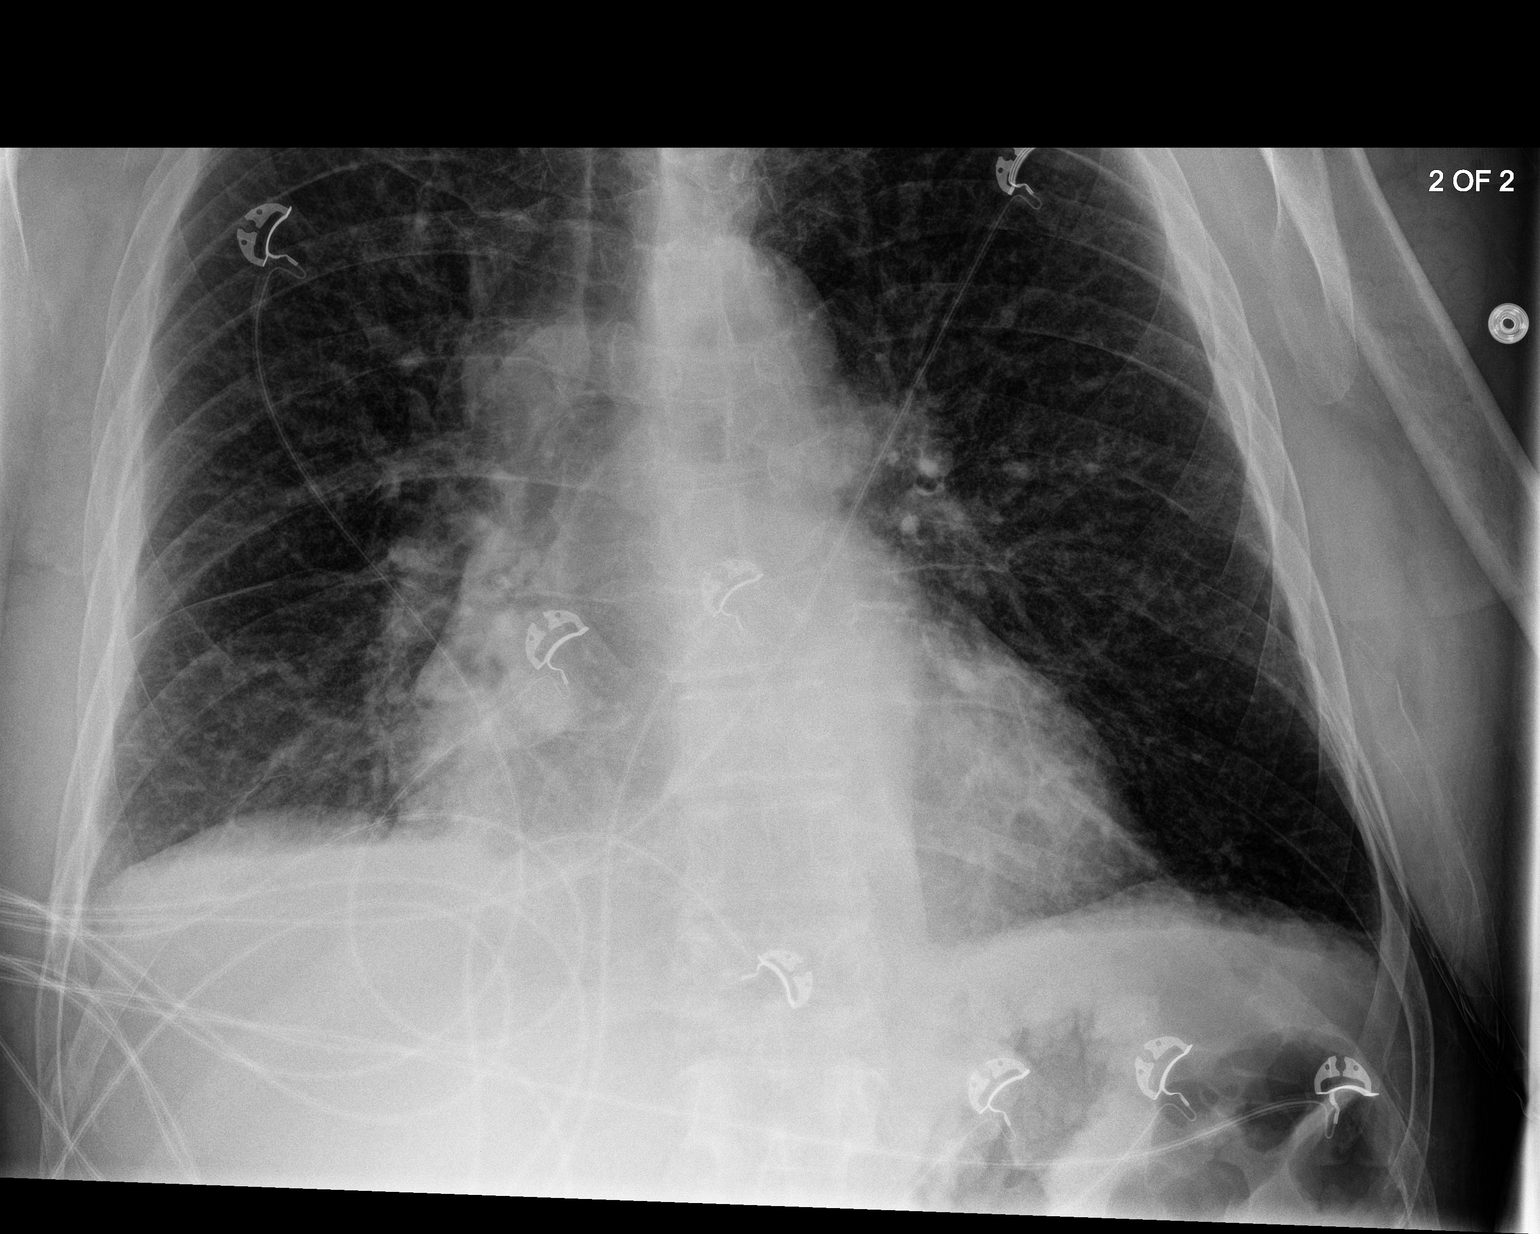

[3 of 3 positions shown; findings below may reference images not displayed]

FINDINGS: The lungs are well-expanded. The interstitial markings are increased
bilaterally. There are new small bilateral pleural effusions. The
cardiac silhouette is mildly enlarged. The central pulmonary
vascularity is prominent. The trachea is midline. The bony thorax
exhibits no acute abnormality.
IMPRESSION: CHF with mild interstitial edema and small bilateral pleural
effusions. Underlying COPD.
